# Patient Record
Sex: Female | Born: 1975 | Race: White | Hispanic: No | Smoking: Former smoker
Health system: Southern US, Community
[De-identification: ages and names within clinical notes are randomized; demographics above are authoritative.]

## PROBLEM LIST (undated history)

## (undated) ENCOUNTER — Inpatient Hospital Stay (HOSPITAL_COMMUNITY): Payer: Self-pay

## (undated) DIAGNOSIS — G5600 Carpal tunnel syndrome, unspecified upper limb: Secondary | ICD-10-CM

## (undated) DIAGNOSIS — Z87442 Personal history of urinary calculi: Secondary | ICD-10-CM

## (undated) DIAGNOSIS — D649 Anemia, unspecified: Secondary | ICD-10-CM

## (undated) DIAGNOSIS — Z9889 Other specified postprocedural states: Secondary | ICD-10-CM

## (undated) DIAGNOSIS — K589 Irritable bowel syndrome without diarrhea: Secondary | ICD-10-CM

## (undated) DIAGNOSIS — R112 Nausea with vomiting, unspecified: Secondary | ICD-10-CM

## (undated) DIAGNOSIS — R933 Abnormal findings on diagnostic imaging of other parts of digestive tract: Secondary | ICD-10-CM

## (undated) DIAGNOSIS — K219 Gastro-esophageal reflux disease without esophagitis: Secondary | ICD-10-CM

## (undated) DIAGNOSIS — L237 Allergic contact dermatitis due to plants, except food: Secondary | ICD-10-CM

## (undated) DIAGNOSIS — O2686 Pruritic urticarial papules and plaques of pregnancy (PUPPP): Secondary | ICD-10-CM

## (undated) DIAGNOSIS — O210 Mild hyperemesis gravidarum: Secondary | ICD-10-CM

## (undated) DIAGNOSIS — N200 Calculus of kidney: Secondary | ICD-10-CM

## (undated) HISTORY — PX: ROOT CANAL: SHX2363

## (undated) HISTORY — DX: Anemia, unspecified: D64.9

## (undated) HISTORY — DX: Irritable bowel syndrome, unspecified: K58.9

## (undated) HISTORY — PX: WISDOM TOOTH EXTRACTION: SHX21

## (undated) HISTORY — DX: Mild hyperemesis gravidarum: O21.0

## (undated) HISTORY — DX: Allergic contact dermatitis due to plants, except food: L23.7

## (undated) HISTORY — PX: COLONOSCOPY: SHX174

## (undated) HISTORY — PX: ABDOMINOPLASTY: SUR9

---

## 2006-02-26 ENCOUNTER — Ambulatory Visit: Payer: Self-pay | Admitting: Gastroenterology

## 2006-03-08 ENCOUNTER — Ambulatory Visit: Payer: Self-pay | Admitting: Gastroenterology

## 2006-03-23 ENCOUNTER — Ambulatory Visit: Payer: Self-pay | Admitting: Gastroenterology

## 2006-03-30 ENCOUNTER — Ambulatory Visit: Payer: Self-pay | Admitting: Gastroenterology

## 2008-04-02 ENCOUNTER — Other Ambulatory Visit: Admission: RE | Admit: 2008-04-02 | Discharge: 2008-04-02 | Payer: Self-pay | Admitting: Obstetrics and Gynecology

## 2010-05-23 ENCOUNTER — Ambulatory Visit (HOSPITAL_COMMUNITY)
Admission: RE | Admit: 2010-05-23 | Discharge: 2010-05-23 | Payer: Self-pay | Source: Home / Self Care | Attending: Obstetrics and Gynecology | Admitting: Obstetrics and Gynecology

## 2010-07-03 ENCOUNTER — Emergency Department (HOSPITAL_COMMUNITY): Payer: BC Managed Care – PPO

## 2010-07-03 ENCOUNTER — Emergency Department (HOSPITAL_COMMUNITY)
Admission: EM | Admit: 2010-07-03 | Discharge: 2010-07-03 | Disposition: A | Discharge status: Home / Self Care | Payer: BC Managed Care – PPO | Attending: Emergency Medicine | Admitting: Emergency Medicine

## 2010-07-03 DIAGNOSIS — R112 Nausea with vomiting, unspecified: Secondary | ICD-10-CM | POA: Insufficient documentation

## 2010-07-03 DIAGNOSIS — N201 Calculus of ureter: Secondary | ICD-10-CM | POA: Insufficient documentation

## 2010-07-03 DIAGNOSIS — R109 Unspecified abdominal pain: Secondary | ICD-10-CM | POA: Insufficient documentation

## 2010-07-03 LAB — URINALYSIS, ROUTINE W REFLEX MICROSCOPIC
Bilirubin Urine: NEGATIVE
Ketones, ur: NEGATIVE mg/dL
Leukocytes, UA: NEGATIVE
Nitrite: NEGATIVE
Protein, ur: NEGATIVE mg/dL
Specific Gravity, Urine: 1.024 (ref 1.005–1.030)
Urine Glucose, Fasting: NEGATIVE mg/dL
Urobilinogen, UA: 0.2 mg/dL (ref 0.0–1.0)
pH: 6.5 (ref 5.0–8.0)

## 2010-07-03 LAB — POCT I-STAT, CHEM 8
BUN: 15 mg/dL (ref 6–23)
HCT: 41 % (ref 36.0–46.0)
Sodium: 140 mEq/L (ref 135–145)

## 2010-07-03 LAB — URINE MICROSCOPIC-ADD ON

## 2010-07-03 LAB — PREGNANCY, URINE: Preg Test, Ur: NEGATIVE

## 2010-09-19 NOTE — Assessment & Plan Note (Signed)
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  She underwent  screening colonoscopy on March 08, 2006, as a direct referral because of a  history of colon polyps removed in [place] 2 years ago.  She has a  grandmother who had colon cancer in her 53's.  Her colonoscopy in November,  2005, was entirely normal.   [name] Huang comes in today for new patient evaluation.  She has had bowel problems  going back to childhood, with periodic attacks of generalized abdominal  spasm with occasional nausea and vomiting if the episodes are severe.  There  are no real precipitating events to her pain.  They will usually last 1/2  hour to 6 hours in duration and will even occur maybe 1-2 times a week or up  to a couple of times a month, occasionally related to her menstrual cycles.  She apparently has had both upper GI and small bowel series and endoscopy in  the past which is unremarkable, although we do not have these reports.  She  has tried dietary journals and has only been able to come up with worsening  of her symptoms by eating apples and bananas.  She is on a high fiber diet  and has excessive gas associated with such.  She apparently has been  diagnosed as having irritable bowel syndrome, underlying irritable bowel  syndrome by a physician in [place].  She denies any hepatobiliary  complaints, significant acid reflux, dysphagia, anorexia, weight loss, or  change in her bowel habits.  With her high fiber diet, she goes to the  bathroom daily.  She has had no fever, chills, skin rashes, joint pains, or  oral stomatitis.  She apparently has seen an allergist and had a  negative  food allergy workup.   Her family of origin is Film/video editor.  She has apparently not been tested,  to her knowledge, for celiac disease.  At the time of her colonoscopy at age  7 she apparently had one adenoma and one hyperplastic polyp.   PAST FAMILY HISTORY:  Patient does have ALLERGIC ASTHMA AND TAKES P.R.N.  ALBUTEROL INHALERS.  She denies any previous abdominal or pelvic surgery.   FAMILY HISTORY:  There are multiple family members with a variety of cancers  including breast, lung, and skin cancer but no known gynecologic cancer or  colon cancer other than her grandmother.  Her father does have diabetes.   SOCIAL HISTORY:  The patient is single and lives with a roommate.  She has a  Master's degree.  She quit smoking a year ago and uses ethanol socially.  She denies problems with ethanol abuse.   REVIEW OF SYSTEMS:  Entirely noncontributory.  Last menstrual period was  February 25, 2006.   MEDICATIONS:  None.  ALLERGIES:  NONE.   PHYSICAL EXAMINATION:  She is an attractive, healthy appearing white female  appearing her stated age.  She is 5 feet 2 inches tall and weighs 158  pounds.  Blood pressure is 100/60 and pulse was 60 and regular.  I could not  appreciate stigmata of chronic liver disease, thyromegaly or  lymphadenopathy.  CHEST:  Entirely clear to percussion and auscultation.  There were no  murmurs, gallops or rubs noted.  She was in a regular rhythm.  ABDOMEN:  There was no hepatosplenomegaly, abdominal masses, tenderness,  bowel sounds were normal.  There was no succession splash noted in the  epigastric area.  _Peripheral extremeties are normal.  MENTAL STATUS:  Clear.  RECTAL:  Deferred.   ASSESSMENT:  It certainly sounds like [name] Huang has irritable bowel syndrome of  an atypical nature.  Her abdominal attacks seem to be precipitated by  menstrual periods and stress.  She is having no current bowel irregularity  because of her high fiber diet which  has subsequently caused a lot of  abdominal gas and bloating.  The patient says she does not have lactose  intolerance.   RECOMMENDATIONS:  1. Check upper abdominal ultrasound exam to exclude cholelithiasis.  2. Screening laboratory parameters include a CBC, metabolic profile,      amylase, lipase, sedimentation rate, CRP, thyroid function test, serum      carotene, and sprue panel along with B12 and folate levels.  3. Sublingual Levsin use p.r.n. as tolerated.  4. Low gas diet with avoidance of non-digestible carbohydrates.  5. Try to obtain again records from [place] for review.  6. Trial of Probiotic therapy in the form of Align one tablet a day.  7. GI follow up in 2-3 weeks' time.     Vania Rea. Jarold Motto, MD, Caleen Essex, FAGA  Electronically Signed    DRP/MedQ  DD: 03/23/2006  DT: 03/23/2006  Job #: [id number]

## 2011-02-13 LAB — OB RESULTS CONSOLE ABO/RH

## 2011-02-13 LAB — OB RESULTS CONSOLE ANTIBODY SCREEN: Antibody Screen: NEGATIVE

## 2011-02-13 LAB — OB RESULTS CONSOLE RUBELLA ANTIBODY, IGM: Rubella: IMMUNE

## 2011-06-20 ENCOUNTER — Encounter (HOSPITAL_COMMUNITY): Payer: Self-pay | Admitting: *Deleted

## 2011-06-20 ENCOUNTER — Inpatient Hospital Stay (HOSPITAL_COMMUNITY)
Admission: AD | Admit: 2011-06-20 | Discharge: 2011-06-20 | Disposition: A | Discharge status: Home / Self Care | Payer: BC Managed Care – PPO | Source: Ambulatory Visit | Attending: Obstetrics and Gynecology | Admitting: Obstetrics and Gynecology

## 2011-06-20 DIAGNOSIS — O99891 Other specified diseases and conditions complicating pregnancy: Secondary | ICD-10-CM | POA: Insufficient documentation

## 2011-06-20 DIAGNOSIS — R059 Cough, unspecified: Secondary | ICD-10-CM | POA: Insufficient documentation

## 2011-06-20 DIAGNOSIS — R05 Cough: Secondary | ICD-10-CM | POA: Insufficient documentation

## 2011-06-20 DIAGNOSIS — J069 Acute upper respiratory infection, unspecified: Secondary | ICD-10-CM | POA: Insufficient documentation

## 2011-06-20 HISTORY — DX: Calculus of kidney: N20.0

## 2011-06-20 HISTORY — DX: Abnormal findings on diagnostic imaging of other parts of digestive tract: R93.3

## 2011-06-20 MED ORDER — GUAIFENESIN-CODEINE 100-10 MG/5ML PO SYRP: 5.0000 mL | ORAL_SOLUTION | Freq: Four times a day (QID) | ORAL | Status: AC | PRN

## 2011-06-20 NOTE — ED Notes (Signed)
Daniella CNM called unit and aware of pt's arrival. Will be in to see pt. Pt aware

## 2011-06-20 NOTE — ED Notes (Signed)
Daniella CNM in to see pt. EFM reviewed.

## 2011-06-20 NOTE — Progress Notes (Signed)
Written and verbal d/c instructions given and understanding voiced. 

## 2011-06-20 NOTE — Progress Notes (Signed)
Pt states, " I've had a sinus infection this week, and been on a Z-pack. It is now draining to my lungs and I'm short of breath today with some mild wheezing. The on call nurse told me to come in."

## 2011-06-20 NOTE — ED Provider Notes (Signed)
History   [name] Huang is a 36 y.o. G3P0020 at [redacted]w[redacted]d gestation. C/O upper respiratory infection for past few days with non-productive cough, rhinorrhea, low grade temp for which she takes Tylenol PRN. Hx of asthma, last attack > 5 yrs ago; feels like chest is "tight" and has a hard time catching her breath after coughing spells. Now feels well, cough usually worse when lying down. Has rescue Ventolin MDI, used x 5 today, feels it's not effective any more. On Z-Pak prophylactic. Used Advair in the past for seasonal triggers.  Normal pregnancy course to date.  Chief Complaint  Patient presents with  . Asthma     OB History    Grav Para Term Preterm Abortions TAB SAB Ect Mult Living   3 0 0  2 0 2 0 0 0      Past Medical History  Diagnosis Date  . Asthma   . Abnormal colonoscopy     precancerous cells removed  . Kidney stone     History reviewed. No pertinent past surgical history.  Family History  Problem Relation Age of Onset  . Anesthesia problems Neg Hx   . Hypotension Neg Hx   . Malignant hyperthermia Neg Hx   . Pseudochol deficiency Neg Hx     History  Substance Use Topics  . Smoking status: Never Smoker   . Smokeless tobacco: Not on file  . Alcohol Use: No    Allergies:  Allergies  Allergen Reactions  . Amoxicillin Hives    Prescriptions prior to admission  Medication Sig Dispense Refill  . acetaminophen (TYLENOL) 500 MG tablet Take 500 mg by mouth every 6 (six) hours as needed. For pain      . albuterol (PROVENTIL HFA;VENTOLIN HFA) 108 (90 BASE) MCG/ACT inhaler Inhale 2 puffs into the lungs every 6 (six) hours as needed.      Marland Kitchen azithromycin (ZITHROMAX) 250 MG tablet Take 500 mg by mouth once.      Marland Kitchen azithromycin (ZITHROMAX) 250 MG tablet Take 250 mg by mouth daily.      . diphenhydrAMINE (BENADRYL) 25 mg capsule Take 25 mg by mouth every 6 (six) hours as needed.      . Prenatal Vit-Fe Fumarate-FA (PRENATAL MULTIVITAMIN) TABS Take 1 tablet by mouth daily.       Marland Kitchen DISCONTD: guaiFENesin (ROBITUSSIN) 100 MG/5ML SOLN Take 5 mLs by mouth every 4 (four) hours as needed.        ROS as noted Physical Exam   Blood pressure 118/72, pulse 87, temperature 97.2 F (36.2 C), temperature source Oral, resp. rate 20, height 5' 2.5" (1.588 m), weight 183 lb 4 oz (83.122 kg), SpO2 100.00%.  Gen: AAO x 3, NAD CV: RRR Pulm: clear bilat, to bases, no wheezes or rhonchi, no stridor, O2 sat 100% at room air Abd: gravid, NT Pelvic: deferred Ext: no edema  FHT's 135, moderate variability, (+) accel's, no decel's Toco: no ctx  ED Course  IUP at [redacted]w[redacted]d with URI and non-productive cough with ? asthma exacerbation / stable Reassuring fetal status  Codeine cough suppressant solution Advised use MDI Ventolin as needed Humidifier at [facility] [place], elevate head of bed for sleep Neti pot rinses BID to reduce rhinorrhea and nasal congestion F/U w/ PCP on Monday / evaluate asthma, need for steroid inhalant  Camden Mazzaferro  06/20/2011 @ 2345

## 2011-06-20 NOTE — Progress Notes (Signed)
Daniella CNM in to see pt. Discussing d/c plan with pt. EFM strip reviewedl

## 2011-07-02 ENCOUNTER — Encounter (HOSPITAL_COMMUNITY): Payer: Self-pay | Admitting: *Deleted

## 2011-07-02 ENCOUNTER — Inpatient Hospital Stay (HOSPITAL_COMMUNITY)
Admission: AD | Admit: 2011-07-02 | Discharge: 2011-07-02 | Disposition: A | Discharge status: Home / Self Care | Payer: BC Managed Care – PPO | Source: Ambulatory Visit | Attending: Obstetrics and Gynecology | Admitting: Obstetrics and Gynecology

## 2011-07-02 DIAGNOSIS — O26899 Other specified pregnancy related conditions, unspecified trimester: Secondary | ICD-10-CM

## 2011-07-02 DIAGNOSIS — R109 Unspecified abdominal pain: Secondary | ICD-10-CM | POA: Insufficient documentation

## 2011-07-02 DIAGNOSIS — O4700 False labor before 37 completed weeks of gestation, unspecified trimester: Secondary | ICD-10-CM | POA: Insufficient documentation

## 2011-07-02 NOTE — Progress Notes (Signed)
Pt states cramping started this morning  About 10am . Pt called about 2000.

## 2011-07-02 NOTE — ED Notes (Signed)
History   36 yo G1P0 MWF presents @ 28 4/[redacted] wk gestation for evaluation of all day cramping since 10 am despite tyenolol and sleeping.  Chief Complaint  Patient presents with  . Abdominal Pain     OB History    Grav Para Term Preterm Abortions TAB SAB Ect Mult Living   3 0 0 0 2 0 2 0 0 0       Past Medical History  Diagnosis Date  . Asthma   . Abnormal colonoscopy     precancerous cells removed  . Kidney stone     History reviewed. No pertinent past surgical history.  Family History  Problem Relation Age of Onset  . Anesthesia problems Neg Hx   . Hypotension Neg Hx   . Malignant hyperthermia Neg Hx   . Pseudochol deficiency Neg Hx   . Hypertension Mother   . Diabetes Mother   . Hypertension Father   . Diabetes Father     History  Substance Use Topics  . Smoking status: Never Smoker   . Smokeless tobacco: Not on file  . Alcohol Use: No    Allergies:  Allergies  Allergen Reactions  . Amoxicillin Hives    Prescriptions prior to admission  Medication Sig Dispense Refill  . acetaminophen (TYLENOL) 500 MG tablet Take 500 mg by mouth every 6 (six) hours as needed. For pain      . albuterol (PROVENTIL HFA;VENTOLIN HFA) 108 (90 BASE) MCG/ACT inhaler Inhale 2 puffs into the lungs every 6 (six) hours as needed.      Marland Kitchen azithromycin (ZITHROMAX) 250 MG tablet Take 500 mg by mouth once.      . diphenhydrAMINE (BENADRYL) 25 mg capsule Take 25 mg by mouth every 6 (six) hours as needed.      . Prenatal Vit-Fe Fumarate-FA (PRENATAL MULTIVITAMIN) TABS Take 1 tablet by mouth daily.      . pseudoephedrine (SUDAFED) 30 MG tablet Take 30 mg by mouth every 4 (four) hours as needed. Sinus pressure         Physical Exam   Blood pressure 126/71, pulse 114, temperature 98 F (36.7 C), temperature source Oral, resp. rate 18.  General appearance: alert, cooperative and no distress Lungs: clear to auscultation bilaterally Heart: regular rate and rhythm, S1, S2 normal, no murmur,  click, rub or gallop Abdomen: gravid soft nontender Pelvic: long closed midposition med ED Course  Abdomiinal cramping IUP @ 28 4/7 wk P) PTL prec. Increase po fluid intake. Keep sched OB appt MDM  Makaleigh Reinard A, MD 10:30 PM 07/02/2011

## 2011-07-02 NOTE — Discharge Instructions (Signed)

## 2011-07-10 ENCOUNTER — Emergency Department (HOSPITAL_COMMUNITY)
Admission: EM | Admit: 2011-07-10 | Discharge: 2011-07-10 | Discharge status: Against Medical Advice | Payer: BC Managed Care – PPO | Attending: Emergency Medicine | Admitting: Emergency Medicine

## 2011-07-10 DIAGNOSIS — X58XXXA Exposure to other specified factors, initial encounter: Secondary | ICD-10-CM | POA: Insufficient documentation

## 2011-07-10 DIAGNOSIS — S99929A Unspecified injury of unspecified foot, initial encounter: Secondary | ICD-10-CM | POA: Insufficient documentation

## 2011-07-10 DIAGNOSIS — S8990XA Unspecified injury of unspecified lower leg, initial encounter: Secondary | ICD-10-CM | POA: Insufficient documentation

## 2011-09-17 ENCOUNTER — Telehealth (HOSPITAL_COMMUNITY): Payer: Self-pay | Admitting: *Deleted

## 2011-09-17 ENCOUNTER — Other Ambulatory Visit: Payer: Self-pay | Admitting: Obstetrics and Gynecology

## 2011-09-17 ENCOUNTER — Encounter (HOSPITAL_COMMUNITY): Payer: Self-pay | Admitting: *Deleted

## 2011-09-17 NOTE — Telephone Encounter (Signed)
Preadmission screen  

## 2011-09-21 ENCOUNTER — Inpatient Hospital Stay (HOSPITAL_COMMUNITY)
Admission: RE | Admit: 2011-09-21 | Discharge: 2011-09-25 | DRG: 371 | Disposition: A | Discharge status: Home / Self Care | Payer: BC Managed Care – PPO | Source: Ambulatory Visit | Attending: Obstetrics and Gynecology | Admitting: Obstetrics and Gynecology

## 2011-09-21 ENCOUNTER — Encounter (HOSPITAL_COMMUNITY): Payer: Self-pay

## 2011-09-21 DIAGNOSIS — O480 Post-term pregnancy: Principal | ICD-10-CM | POA: Diagnosis present

## 2011-09-21 DIAGNOSIS — O3660X0 Maternal care for excessive fetal growth, unspecified trimester, not applicable or unspecified: Secondary | ICD-10-CM | POA: Diagnosis present

## 2011-09-21 DIAGNOSIS — O409XX0 Polyhydramnios, unspecified trimester, not applicable or unspecified: Secondary | ICD-10-CM | POA: Diagnosis present

## 2011-09-21 HISTORY — DX: Carpal tunnel syndrome, unspecified upper limb: G56.00

## 2011-09-21 HISTORY — DX: Pruritic urticarial papules and plaques of pregnancy (puppp): O26.86

## 2011-09-21 LAB — CBC
HCT: 36.8 % (ref 36.0–46.0)
Hemoglobin: 12.5 g/dL (ref 12.0–15.0)
MCH: 30.8 pg (ref 26.0–34.0)
MCV: 90.6 fL (ref 78.0–100.0)
RBC: 4.06 MIL/uL (ref 3.87–5.11)
WBC: 10.6 10*3/uL — ABNORMAL HIGH (ref 4.0–10.5)

## 2011-09-21 MED ORDER — FLEET ENEMA 7-19 GM/118ML RE ENEM: 1.0000 | ENEMA | RECTAL | Status: DC | PRN

## 2011-09-21 MED ORDER — LACTATED RINGERS IV SOLN
INTRAVENOUS | Status: DC
  Administered 2011-09-22 (×2): via INTRAVENOUS

## 2011-09-21 MED ORDER — ACETAMINOPHEN 325 MG PO TABS: 650.0000 mg | ORAL_TABLET | ORAL | Status: DC | PRN

## 2011-09-21 MED ORDER — DOXYLAMINE SUCCINATE (SLEEP) 25 MG PO TABS
25.0000 mg | ORAL_TABLET | Freq: Every evening | ORAL | Status: DC | PRN
  Administered 2011-09-21: 25 mg via ORAL
  Filled 2011-09-21: qty 1

## 2011-09-21 MED ORDER — OXYTOCIN 20 UNITS IN LACTATED RINGERS INFUSION - SIMPLE
1.0000 m[IU]/min | INTRAVENOUS | Status: DC
  Administered 2011-09-22: 1 m[IU]/min via INTRAVENOUS

## 2011-09-21 MED ORDER — TERBUTALINE SULFATE 1 MG/ML IJ SOLN: 0.2500 mg | Freq: Once | INTRAMUSCULAR | Status: AC | PRN

## 2011-09-21 MED ORDER — ZOLPIDEM TARTRATE 10 MG PO TABS: 10.0000 mg | ORAL_TABLET | Freq: Every evening | ORAL | Status: DC | PRN

## 2011-09-21 MED ORDER — LACTATED RINGERS IV SOLN
500.0000 mL | INTRAVENOUS | Status: DC | PRN
  Administered 2011-09-21: 1000 mL via INTRAVENOUS

## 2011-09-21 MED ORDER — OXYCODONE-ACETAMINOPHEN 5-325 MG PO TABS: 1.0000 | ORAL_TABLET | ORAL | Status: DC | PRN

## 2011-09-21 MED ORDER — OXYTOCIN 20 UNITS IN LACTATED RINGERS INFUSION - SIMPLE: 125.0000 mL/h | Freq: Once | INTRAVENOUS | Status: DC

## 2011-09-21 MED ORDER — OXYTOCIN BOLUS FROM INFUSION
500.0000 mL | Freq: Once | INTRAVENOUS | Status: DC
  Filled 2011-09-21: qty 500
  Filled 2011-09-21: qty 1000

## 2011-09-21 MED ORDER — OXYTOCIN 20 UNITS IN LACTATED RINGERS INFUSION - SIMPLE: 1.0000 m[IU]/min | INTRAVENOUS | Status: DC

## 2011-09-21 MED ORDER — LIDOCAINE HCL (PF) 1 % IJ SOLN: 30.0000 mL | INTRAMUSCULAR | Status: DC | PRN

## 2011-09-21 MED ORDER — ONDANSETRON HCL 4 MG/2ML IJ SOLN
4.0000 mg | Freq: Four times a day (QID) | INTRAMUSCULAR | Status: DC | PRN
  Administered 2011-09-22: 4 mg via INTRAVENOUS
  Filled 2011-09-21: qty 2

## 2011-09-21 MED ORDER — CITRIC ACID-SODIUM CITRATE 334-500 MG/5ML PO SOLN
30.0000 mL | ORAL | Status: DC | PRN
  Administered 2011-09-21 – 2011-09-22 (×4): 30 mL via ORAL
  Filled 2011-09-21 (×4): qty 15

## 2011-09-21 MED ORDER — MISOPROSTOL 25 MCG QUARTER TABLET
25.0000 ug | ORAL_TABLET | ORAL | Status: DC | PRN
  Administered 2011-09-21: 25 ug via VAGINAL
  Filled 2011-09-21: qty 0.25

## 2011-09-21 MED ORDER — IBUPROFEN 600 MG PO TABS: 600.0000 mg | ORAL_TABLET | Freq: Four times a day (QID) | ORAL | Status: DC | PRN

## 2011-09-21 NOTE — H&P (Signed)
[name], Huang NO.:  [id number]  MEDICAL RECORD NO.:  [id number]  LOCATION:  9173                          FACILITY:  [facility]  PHYSICIAN:  Lenoard Aden, M.D.DATE OF BIRTH:  [birth year]  DATE OF ADMISSION:  09/21/2011 DATE OF DISCHARGE:                             HISTORY & PHYSICAL   CHIEF COMPLAINT:  Post dates with LGA, mild polyhydramnios and marginal cord insertion, for induction of 40 weeks.  HISTORY OF PRESENT ILLNESS:  She is a 36 year old white female, G3, P0, at 40 weeks and 1 day, with a history of postterm status marginal cord insertion for presumed LGA, apparently some cervical ripening and induction.  ALLERGIES:  PENICILLIN.  No latex allergies.  SOCIAL HISTORY:  Nonsmoker.  Nondrinker.  Denies physical violence.  PAST MEDICAL HISTORY:  Asthma, colonic polyps, also induced spontaneous abortion in 2004 and 2012.  FAMILY HISTORY:  Breast cancer, hypertension, bipolar disorder, heart disease, diabetes, and unspecified cancer.  Prenatal course was complicated as noted by marginal cord insertion ________.  PHYSICAL EXAMINATION:  GENERAL:  Well-developed, well-nourished white female, in no acute distress. VITAL SIGNS:  Blood pressure 120/86. HEENT:  Normal. NECK:  Supple.  Full range of motion. LUNGS:  Clear. HEART:  Regular rate and rhythm. ABDOMEN:  Soft, gravid, nontender.  Estimated fetal weight 8.5 pounds. Cervix is fingertip, 50%, vertex, soft, anterior -2. EXTREMITIES:  There are no cords. NEUROLOGIC:  Nonfocal. SKIN:  Intact.  IMPRESSION: 1. Forty weeks and one day intrauterine pregnancy. 2. Marginal cord insertion. 3. Large for gestational age with polyhydramnios.  PLAN:  Proceed with induction of Cytotec placement.     Lenoard Aden, M.D.     RJT/MEDQ  D:  09/21/2011  T:  09/21/2011  Job:  [id number]

## 2011-09-21 NOTE — Progress Notes (Signed)
[name] Huang is a 36 y.o. G3P0020 at [redacted]w[redacted]d by LMP admitted for induction of labor due to Post dates. Due date 5/18 and presumed LGA with mild Polyhydramnios.  Subjective: Comfortable.  Objective: BP 129/86  Pulse 101  Temp(Src) 98.2 F (36.8 C) (Oral)  Resp 18  Ht 5\' 2"  (1.575 m)  Wt 94.802 kg (209 lb)  BMI 38.23 kg/m2      FHT:  FHR: 145 bpm, variability: moderate,  accelerations:  Present,  decelerations:  Absent UC:   irregular, every 10 minutes SVE:   FT/50/VTX/soft /anterior/-1 Cytotec placed Labs: Lab Results  Component Value Date   WBC 10.6* 09/21/2011   HGB 12.5 09/21/2011   HCT 36.8 09/21/2011   MCV 90.6 09/21/2011   PLT 233 09/21/2011    Assessment / Plan: Postdates LGA/Polyhydramnios  Labor: Progressing normally on Cytotec Preeclampsia:  na Fetal Wellbeing:  Category I Pain Control:  Labor support without medications I/D:  n/a Anticipated MOD:  Cautious approach to SVD vs Csec. Pt understands inherent risks in induction vs expectant management.  [name] Huang J 09/21/2011, 9:38 PM

## 2011-09-22 ENCOUNTER — Encounter (HOSPITAL_COMMUNITY): Payer: Self-pay | Admitting: Anesthesiology

## 2011-09-22 ENCOUNTER — Encounter (HOSPITAL_COMMUNITY)
Admission: RE | Disposition: A | Discharge status: Home / Self Care | Payer: Self-pay | Source: Ambulatory Visit | Attending: Obstetrics and Gynecology

## 2011-09-22 ENCOUNTER — Inpatient Hospital Stay (HOSPITAL_COMMUNITY): Payer: BC Managed Care – PPO | Admitting: Anesthesiology

## 2011-09-22 ENCOUNTER — Encounter (HOSPITAL_COMMUNITY): Payer: Self-pay

## 2011-09-22 SURGERY — Surgical Case
Anesthesia: Epidural | Site: Abdomen | Wound class: Clean Contaminated

## 2011-09-22 MED ORDER — BUPIVACAINE HCL (PF) 0.25 % IJ SOLN
INTRAMUSCULAR | Status: AC
  Filled 2011-09-22: qty 30

## 2011-09-22 MED ORDER — EPHEDRINE 5 MG/ML INJ: 10.0000 mg | INTRAVENOUS | Status: DC | PRN

## 2011-09-22 MED ORDER — OXYTOCIN 10 UNIT/ML IJ SOLN
INTRAMUSCULAR | Status: DC | PRN
  Administered 2011-09-22 (×2): 20 [IU] via INTRAMUSCULAR

## 2011-09-22 MED ORDER — PHENYLEPHRINE 40 MCG/ML (10ML) SYRINGE FOR IV PUSH (FOR BLOOD PRESSURE SUPPORT)
80.0000 ug | PREFILLED_SYRINGE | INTRAVENOUS | Status: DC | PRN
  Filled 2011-09-22: qty 5

## 2011-09-22 MED ORDER — LIDOCAINE HCL (PF) 1 % IJ SOLN
INTRAMUSCULAR | Status: DC | PRN
  Administered 2011-09-22 (×2): 8 mL

## 2011-09-22 MED ORDER — OXYTOCIN 20 UNITS IN LACTATED RINGERS INFUSION - SIMPLE: 1.0000 m[IU]/min | INTRAVENOUS | Status: DC

## 2011-09-22 MED ORDER — MORPHINE SULFATE 0.5 MG/ML IJ SOLN
INTRAMUSCULAR | Status: AC
  Filled 2011-09-22: qty 10

## 2011-09-22 MED ORDER — SODIUM BICARBONATE 8.4 % IV SOLN
INTRAVENOUS | Status: DC | PRN
  Administered 2011-09-22 (×2): 5 mL via EPIDURAL

## 2011-09-22 MED ORDER — MEPERIDINE HCL 25 MG/ML IJ SOLN
INTRAMUSCULAR | Status: AC
  Filled 2011-09-22: qty 1

## 2011-09-22 MED ORDER — MORPHINE SULFATE (PF) 0.5 MG/ML IJ SOLN
INTRAMUSCULAR | Status: DC | PRN
  Administered 2011-09-22: 4 mg via EPIDURAL

## 2011-09-22 MED ORDER — ONDANSETRON HCL 4 MG/2ML IJ SOLN
INTRAMUSCULAR | Status: AC
  Filled 2011-09-22: qty 2

## 2011-09-22 MED ORDER — FENTANYL 2.5 MCG/ML BUPIVACAINE 1/10 % EPIDURAL INFUSION (WH - ANES)
INTRAMUSCULAR | Status: DC | PRN
  Administered 2011-09-22: 14 mL/h via EPIDURAL

## 2011-09-22 MED ORDER — OXYTOCIN 10 UNIT/ML IJ SOLN
INTRAMUSCULAR | Status: AC
  Filled 2011-09-22: qty 2

## 2011-09-22 MED ORDER — PHENYLEPHRINE 40 MCG/ML (10ML) SYRINGE FOR IV PUSH (FOR BLOOD PRESSURE SUPPORT)
PREFILLED_SYRINGE | INTRAVENOUS | Status: AC
  Filled 2011-09-22: qty 5

## 2011-09-22 MED ORDER — MEPERIDINE HCL 25 MG/ML IJ SOLN
INTRAMUSCULAR | Status: DC | PRN
  Administered 2011-09-22: 12.5 mg via INTRAVENOUS

## 2011-09-22 MED ORDER — DEXTROSE 5 % IV SOLN
2.0000 g | INTRAVENOUS | Status: AC
  Administered 2011-09-22: 2 g via INTRAVENOUS
  Filled 2011-09-22: qty 2

## 2011-09-22 MED ORDER — FENTANYL 2.5 MCG/ML BUPIVACAINE 1/10 % EPIDURAL INFUSION (WH - ANES)
14.0000 mL/h | INTRAMUSCULAR | Status: DC
  Administered 2011-09-22 (×3): 14 mL/h via EPIDURAL
  Filled 2011-09-22 (×4): qty 60

## 2011-09-22 MED ORDER — DIPHENHYDRAMINE HCL 50 MG/ML IJ SOLN: 12.5000 mg | INTRAMUSCULAR | Status: DC | PRN

## 2011-09-22 MED ORDER — EPHEDRINE 5 MG/ML INJ
10.0000 mg | INTRAVENOUS | Status: DC | PRN
  Filled 2011-09-22: qty 4

## 2011-09-22 MED ORDER — LACTATED RINGERS IV SOLN
500.0000 mL | Freq: Once | INTRAVENOUS | Status: AC
  Administered 2011-09-22: 1000 mL via INTRAVENOUS

## 2011-09-22 MED ORDER — LACTATED RINGERS IV SOLN
INTRAVENOUS | Status: DC | PRN
  Administered 2011-09-22 (×3): via INTRAVENOUS

## 2011-09-22 MED ORDER — MORPHINE SULFATE (PF) 0.5 MG/ML IJ SOLN
INTRAMUSCULAR | Status: DC | PRN
  Administered 2011-09-22: 1 mg via INTRAVENOUS

## 2011-09-22 MED ORDER — PHENYLEPHRINE 40 MCG/ML (10ML) SYRINGE FOR IV PUSH (FOR BLOOD PRESSURE SUPPORT): 80.0000 ug | PREFILLED_SYRINGE | INTRAVENOUS | Status: DC | PRN

## 2011-09-22 MED ORDER — BUPIVACAINE HCL (PF) 0.25 % IJ SOLN
INTRAMUSCULAR | Status: DC | PRN
  Administered 2011-09-22: 10 mL

## 2011-09-22 SURGICAL SUPPLY — 30 items
CLOTH BEACON ORANGE TIMEOUT ST (SAFETY) ×2 IMPLANT
CONTAINER PREFILL 10% NBF 15ML (MISCELLANEOUS) IMPLANT
DRESSING TELFA 8X3 (GAUZE/BANDAGES/DRESSINGS) IMPLANT
ELECT REM PT RETURN 9FT ADLT (ELECTROSURGICAL) ×2
ELECTRODE REM PT RTRN 9FT ADLT (ELECTROSURGICAL) ×1 IMPLANT
EXTRACTOR VACUUM M CUP 4 TUBE (SUCTIONS) IMPLANT
GAUZE SPONGE 4X4 12PLY STRL LF (GAUZE/BANDAGES/DRESSINGS) ×2 IMPLANT
GLOVE BIO SURGEON STRL SZ7.5 (GLOVE) ×4 IMPLANT
GOWN PREVENTION PLUS LG XLONG (DISPOSABLE) ×4 IMPLANT
GOWN PREVENTION PLUS XLARGE (GOWN DISPOSABLE) ×2 IMPLANT
KIT ABG SYR 3ML LUER SLIP (SYRINGE) IMPLANT
NDL HYPO 25X1 1.5 SAFETY (NEEDLE) ×1 IMPLANT
NDL HYPO 25X5/8 SAFETYGLIDE (NEEDLE) IMPLANT
NEEDLE HYPO 25X1 1.5 SAFETY (NEEDLE) ×2 IMPLANT
NEEDLE HYPO 25X5/8 SAFETYGLIDE (NEEDLE) IMPLANT
NS IRRIG 1000ML POUR BTL (IV SOLUTION) ×2 IMPLANT
PACK C SECTION WH (CUSTOM PROCEDURE TRAY) ×2 IMPLANT
PAD ABD 7.5X8 STRL (GAUZE/BANDAGES/DRESSINGS) IMPLANT
SLEEVE SCD COMPRESS KNEE MED (MISCELLANEOUS) IMPLANT
STAPLER VISISTAT 35W (STAPLE) ×2 IMPLANT
SUT MNCRL 0 VIOLET CTX 36 (SUTURE) ×2 IMPLANT
SUT MON AB 2-0 CT1 27 (SUTURE) ×2 IMPLANT
SUT MON AB-0 CT1 36 (SUTURE) ×4 IMPLANT
SUT MONOCRYL 0 CTX 36 (SUTURE) ×3
SUT PLAIN 0 NONE (SUTURE) IMPLANT
SUT PLAIN 2 0 XLH (SUTURE) ×1 IMPLANT
SYR CONTROL 10ML LL (SYRINGE) ×1 IMPLANT
TOWEL OR 17X24 6PK STRL BLUE (TOWEL DISPOSABLE) ×4 IMPLANT
TRAY FOLEY CATH 14FR (SET/KITS/TRAYS/PACK) ×1 IMPLANT
WATER STERILE IRR 1000ML POUR (IV SOLUTION) ×1 IMPLANT

## 2011-09-22 NOTE — Progress Notes (Signed)
Still c/o of pain at site. No inflitration noted.

## 2011-09-22 NOTE — Progress Notes (Signed)
[name] Huang is a 36 y.o. G3P0020 at [redacted]w[redacted]d by LMP admitted for induction of labor due to Post dates. Due date 5/18  and Marginal CI with borderline oligo.  Subjective: Comfortable.  Objective: BP 146/86  Pulse 83  Temp(Src) 98 F (36.7 C) (Oral)  Resp 18  Ht 5\' 2"  (1.575 m)  Wt 94.802 kg (209 lb)  BMI 38.23 kg/m2  SpO2 100%   Total I/O In: -  Out: 150 [Urine:150]  FHT:  FHR: 155 bpm, variability: moderate,  accelerations:  Present,  decelerations:  Absent UC:   regular, every 3 minutes(MVUs ~200) SVE:   6/80/0  Labs: Lab Results  Component Value Date   WBC 10.6* 09/21/2011   HGB 12.5 09/21/2011   HCT 36.8 09/21/2011   MCV 90.6 09/21/2011   PLT 233 09/21/2011    Assessment / Plan: Induction of labor due to Marginal CI and Postterm,  progressing well on pitocin  Labor: Progressing on Pitocin and adequate by IUPC Preeclampsia:  na Fetal Wellbeing:  Category I Pain Control:  Epidural I/D:  n/a Anticipated MOD:  SVD vs Csection  [name] 09/22/2011, 5:21 PM

## 2011-09-22 NOTE — Progress Notes (Signed)
MD wants pitocin to be started if unable to place another cytotec

## 2011-09-22 NOTE — Progress Notes (Signed)
[name] Huang is a 36 y.o. G3P0020 at [redacted]w[redacted]d by LMP admitted for induction of labor due to Post dates. Due date 5/18 and Marginal CI.  Subjective: Resting well with epidural  Objective: BP 155/100  Pulse 92  Temp(Src) 98.6 F (37 C) (Oral)  Resp 20  Ht 5\' 2"  (1.575 m)  Wt 94.802 kg (209 lb)  BMI 38.23 kg/m2  SpO2 100% I/O last 3 completed shifts: In: -  Out: 150 [Urine:150] Total I/O In: 209.6 [I.V.:209.6] Out: -   FHT:  FHR: 155 bpm, variability: moderate,  accelerations:  Present,  decelerations:  Absent UC:   regular, every 2 minutes MVUs 200 SVE:   8/OT/0  Labs: Lab Results  Component Value Date   WBC 10.6* 09/21/2011   HGB 12.5 09/21/2011   HCT 36.8 09/21/2011   MCV 90.6 09/21/2011   PLT 233 09/21/2011    Assessment / Plan: Induction of labor due to postterm and marginal CI,  progressing well on pitocin  Labor: Progressing normally Preeclampsia:  na Fetal Wellbeing:  Category I Pain Control:  Epidural I/D:  n/a Anticipated MOD:  SVD vs Csection.  [name] Huang J 09/22/2011, 8:28 PM

## 2011-09-22 NOTE — Op Note (Signed)
Cesarean Section Procedure Note  Indications: failure to progress: arrest of dilation  Pre-operative Diagnosis: 40 week 2 day pregnancy.  Post-operative Diagnosis: same  Surgeon: Lenoard Aden   Assistants: mody  Anesthesia: Epidural anesthesia and Local anesthesia 0.25.% bupivacaine  ASA Class: 2  Procedure Details  The patient was seen in the Holding Room. The risks, benefits, complications, treatment options, and expected outcomes were discussed with the patient.  The patient concurred with the proposed plan, giving informed consent. The risks of anesthesia, infection, bleeding and possible injury to other organs discussed. Injury to bowel, bladder, or ureter with possible need for repair discussed. Possible need for transfusion with secondary risks of hepatitis or HIV acquisition discussed. Post operative complications to include but not limited to DVT, PE and Pneumonia noted. The site of surgery properly noted/marked. The patient was taken to Operating Room # 1, identified as Tomasa Dobransky and the procedure verified as C-Section Delivery. A Time Out was held and the above information confirmed.  After induction of anesthesia, the patient was draped and prepped in the usual sterile manner. A Pfannenstiel incision was made and carried down through the subcutaneous tissue to the fascia. Fascial incision was made and extended transversely using Mayo scissors. The fascia was separated from the underlying rectus tissue superiorly and inferiorly. The peritoneum was identified and entered. Peritoneal incision was extended longitudinally. The utero-vesical peritoneal reflection was incised transversely and the bladder flap was bluntly freed from the lower uterine segment. A low transverse uterine incision(Kerr hysterotomy) was made. Delivered from OP presentation was a  female with Apgar scores of 8 at one minute and 9 at five minutes. Bulb suctioning gently performed. Neonatal team in  attendance.After the umbilical cord was clamped and cut cord blood was obtained for evaluation. The placenta was removed intact and appeared normal. The uterus was curetted with a dry lap pack. Good hemostasis was noted.The uterine outline, tubes and ovaries appeared normal. The uterine incision was closed with running locked sutures of 0 Monocryl x 2 layers. Hemostasis was observed. Lavage was carried out until clear.The parietal peritoneum was closed with a running 2-0 Monocryl suture. The fascia was then reapproximated with running sutures of 0 Monocryl. [place] tissue closed with 2-0 plain suture.The skin was reapproximated with staples.  Instrument, sponge, and needle counts were correct prior the abdominal closure and at the conclusion of the case.   Findings: above  Estimated Blood Loss:  500         Drains: foley                 Specimens: placenta to path                 Complications:  None; patient tolerated the procedure well.         Disposition: PACU - hemodynamically stable.         Condition: stable  Attending Attestation: I performed the procedure.

## 2011-09-22 NOTE — Anesthesia Procedure Notes (Signed)
Epidural Patient location during procedure: OB Start time: 09/22/2011 9:37 AM End time: 09/22/2011 9:42 AM Reason for block: procedure for pain  Preanesthetic Checklist Completed: patient identified, site marked, surgical consent, pre-op evaluation, timeout performed, IV checked, risks and benefits discussed and monitors and equipment checked  Epidural Patient position: sitting Prep: site prepped and draped and DuraPrep Patient monitoring: continuous pulse ox and blood pressure Approach: midline Injection technique: LOR air  Needle:  Needle type: Tuohy  Needle gauge: 17 G Needle length: 9 cm Needle insertion depth: 5 cm cm Catheter type: closed end flexible Catheter size: 19 Gauge Catheter at skin depth: 10 cm Test dose: negative and Other  Assessment Sensory level: T10 Events: blood not aspirated, injection not painful, no injection resistance, negative IV test and no paresthesia

## 2011-09-22 NOTE — Anesthesia Preprocedure Evaluation (Addendum)
Anesthesia Evaluation  Patient identified by MRN, date of birth, ID band Patient awake    Reviewed: Allergy & Precautions, H&P , NPO status , Patient's Chart, lab work & pertinent test results  Airway Mallampati: II TM Distance: >3 FB Neck ROM: full    Dental No notable dental hx.    Pulmonary    Pulmonary exam normal       Cardiovascular negative cardio ROS      Neuro/Psych negative psych ROS   GI/Hepatic negative GI ROS, Neg liver ROS,   Endo/Other  Morbid obesity  Renal/GU negative Renal ROS  negative genitourinary   Musculoskeletal negative musculoskeletal ROS (+)   Abdominal (+) + obese,   Peds negative pediatric ROS (+)  Hematology negative hematology ROS (+)   Anesthesia Other Findings   Reproductive/Obstetrics (+) Pregnancy                           Anesthesia Physical Anesthesia Plan  ASA: III  Anesthesia Plan: Epidural   Post-op Pain Management:    Induction:   Airway Management Planned:   Additional Equipment:   Intra-op Plan:   Post-operative Plan:   Informed Consent: I have reviewed the patients History and Physical, chart, labs and discussed the procedure including the risks, benefits and alternatives for the proposed anesthesia with the patient or authorized representative who has indicated his/her understanding and acceptance.     Plan Discussed with:   Anesthesia Plan Comments:         Anesthesia Quick Evaluation  

## 2011-09-22 NOTE — Progress Notes (Signed)
C/o of pain at site. No inflitration noted. Will keep assessing

## 2011-09-22 NOTE — Progress Notes (Signed)
[name] Huang is a 36 y.o. G3P0020 at [redacted]w[redacted]d by LMP admitted for induction of labor due to Post dates. Due date 5/18 and Marginal CI.  Subjective: Comfortable  Objective: BP 127/92  Pulse 74  Temp(Src) 97.9 F (36.6 C) (Oral)  Resp 20  Ht 5\' 2"  (1.575 m)  Wt 94.802 kg (209 lb)  BMI 38.23 kg/m2      FHT:  FHR: 145 bpm, variability: moderate,  accelerations:  Present,  decelerations:  Absent UC:   regular, every 2-3 minutes SVE:   Dilation: 2 Effacement (%): 60 Station: -2 Exam by:: Dr. Billy Coast AROM- clear  Labs: Lab Results  Component Value Date   WBC 10.6* 09/21/2011   HGB 12.5 09/21/2011   HCT 36.8 09/21/2011   MCV 90.6 09/21/2011   PLT 233 09/21/2011    Assessment / Plan: Induction of labor due to postterm and Marginal CI with borderline oligo,  progressing well on pitocin  Labor: Progressing on Pitocin Preeclampsia:  na Fetal Wellbeing:  Category I Pain Control:  Epidural I/D:  n/a Anticipated MOD:  Cautious approach to SVD vs Csection  [name] Huang J 09/22/2011, 8:37 AM

## 2011-09-22 NOTE — Progress Notes (Signed)
Dr. Billy Coast phoned to check patient status.  Physician heading in to perform vaginal exam.

## 2011-09-22 NOTE — Progress Notes (Signed)
[name] Huang is a 36 y.o. G3P0020 at [redacted]w[redacted]d by LMP admitted for induction of labor due to Post dates. Due date 5/18 and Marginal CI.  Subjective: Comfortable  Objective: BP 142/93  Pulse 82  Temp(Src) 97.9 F (36.6 C) (Oral)  Resp 20  Ht 5\' 2"  (1.575 m)  Wt 94.802 kg (209 lb)  BMI 38.23 kg/m2  SpO2 100%      FHT:  FHR: 155 bpm, variability: moderate,  accelerations:  Present,  decelerations:  Absent UC:   regular, every 3 minutes SVE:   Dilation: 4 Effacement (%): 80 Station: -1 Exam by:: [name] Hageman, RN  IUPC placed without difficulty  Labs: Lab Results  Component Value Date   WBC 10.6* 09/21/2011   HGB 12.5 09/21/2011   HCT 36.8 09/21/2011   MCV 90.6 09/21/2011   PLT 233 09/21/2011    Assessment / Plan: Induction of labor due to postterm and Marginal CI,  progressing well on pitocin  Labor: Progressing normally Preeclampsia:  na Fetal Wellbeing:  Category I Pain Control:  Epidural I/D:  n/a Anticipated MOD:  NSVD vs Csection  [name] Huang J 09/22/2011, 1:47 PM

## 2011-09-22 NOTE — Progress Notes (Signed)
Makaylyn Sinyard is a 36 y.o. G3P0020 at [redacted]w[redacted]d by LMP admitted for induction of labor due to Post dates. Due date 5/18, Low amniotic fluid. and Marginal Cord insertion.  Subjective: Comfortable  Objective: BP 137/86  Pulse 85  Temp(Src) 98.2 F (36.8 C) (Oral)  Resp 20  Ht 5\' 2"  (1.575 m)  Wt 94.802 kg (209 lb)  BMI 38.23 kg/m2      FHT:  FHR: 155 bpm, variability: moderate,  accelerations:  Present,  decelerations:  Absent UC:   regular, every 3-5 minutes SVE:   1/60/soft /anterior /-1  Labs: Lab Results  Component Value Date   WBC 10.6* 09/21/2011   HGB 12.5 09/21/2011   HCT 36.8 09/21/2011   MCV 90.6 09/21/2011   PLT 233 09/21/2011    Assessment / Plan: Induction of labor due to postterm and Marginal Cord Insertion,  progressing well on pitocin  Labor: Progressing on Pitocin, will continue to increase then AROM Preeclampsia:  na Fetal Wellbeing:  Category I Pain Control:  Epidural I/D:  n/a Anticipated MOD:  Csection vs SVD  Skyllar Notarianni J 09/22/2011, 7:21 AM

## 2011-09-22 NOTE — Consult Note (Signed)
Neonatology Note:  Attendance at C-section:  I was asked to attend this primary C/S at term due to failure of descent. The mother is a G3P0A2 A pos, GBS neg with an uncomplicated pregnancy. ROM 15 hours prior to delivery, fluid clear. Infant vigorous with good spontaneous cry and tone. Needed only minimal bulb suctioning. Ap 9/9. Lungs clear to ausc in DR. To CN to care of Pediatrician.  Praneeth Bussey, MD  

## 2011-09-22 NOTE — Progress Notes (Signed)
[name] Huang is a 36 y.o. G3P0020 at [redacted]w[redacted]d by LMP admitted for induction of labor due to Post dates. Due date 5/18 and Marginal CI.  Subjective: Feels occ pressure  Objective: BP 121/86  Pulse 103  Temp(Src) 98.6 F (37 C) (Oral)  Resp 18  Ht 5\' 2"  (1.575 m)  Wt 94.802 kg (209 lb)  BMI 38.23 kg/m2  SpO2 100% I/O last 3 completed shifts: In: -  Out: 150 [Urine:150] Total I/O In: 260.6 [I.V.:260.6] Out: 1000 [Urine:1000]  FHT:  FHR: 155 bpm, variability: moderate,  accelerations:  Present,  decelerations:  Absent UC:   regular, every 2 minutes( MVUs 250) SVE:   Dilation: 8 Effacement (%): 80 (right side edematous) Station: 0 Exam by:: Bennye Alm, RN  Labs: Lab Results  Component Value Date   WBC 10.6* 09/21/2011   HGB 12.5 09/21/2011   HCT 36.8 09/21/2011   MCV 90.6 09/21/2011   PLT 233 09/21/2011    Assessment / Plan: Arrest in active phase of labor  Labor: no progression with adequate labor Preeclampsia:  na Fetal Wellbeing:  Category I Pain Control:  Epidural I/D:  n/a Anticipated MOD:  Cesarean section. Risks and benefits discussed.  Kentrell Hallahan J 09/22/2011, 10:50 PM

## 2011-09-23 ENCOUNTER — Encounter (HOSPITAL_COMMUNITY): Payer: Self-pay

## 2011-09-23 LAB — CBC
Hemoglobin: 10.6 g/dL — ABNORMAL LOW (ref 12.0–15.0)
MCH: 31 pg (ref 26.0–34.0)
MCHC: 34.3 g/dL (ref 30.0–36.0)
MCV: 90.4 fL (ref 78.0–100.0)
RBC: 3.42 MIL/uL — ABNORMAL LOW (ref 3.87–5.11)

## 2011-09-23 MED ORDER — ONDANSETRON HCL 4 MG/2ML IJ SOLN: 4.0000 mg | INTRAMUSCULAR | Status: DC | PRN

## 2011-09-23 MED ORDER — DIPHENHYDRAMINE HCL 50 MG/ML IJ SOLN: 12.5000 mg | INTRAMUSCULAR | Status: DC | PRN

## 2011-09-23 MED ORDER — MEPERIDINE HCL 25 MG/ML IJ SOLN
INTRAMUSCULAR | Status: AC
  Filled 2011-09-23: qty 1

## 2011-09-23 MED ORDER — NALOXONE HCL 0.4 MG/ML IJ SOLN: 0.4000 mg | INTRAMUSCULAR | Status: DC | PRN

## 2011-09-23 MED ORDER — OXYTOCIN 20 UNITS IN LACTATED RINGERS INFUSION - SIMPLE
INTRAVENOUS | Status: AC
  Filled 2011-09-23: qty 1000

## 2011-09-23 MED ORDER — OXYTOCIN 20 UNITS IN LACTATED RINGERS INFUSION - SIMPLE: 125.0000 mL/h | INTRAVENOUS | Status: AC

## 2011-09-23 MED ORDER — ONDANSETRON HCL 4 MG PO TABS: 4.0000 mg | ORAL_TABLET | ORAL | Status: DC | PRN

## 2011-09-23 MED ORDER — DIPHENHYDRAMINE HCL 50 MG/ML IJ SOLN: 25.0000 mg | INTRAMUSCULAR | Status: DC | PRN

## 2011-09-23 MED ORDER — IBUPROFEN 600 MG PO TABS
600.0000 mg | ORAL_TABLET | Freq: Four times a day (QID) | ORAL | Status: DC | PRN
  Filled 2011-09-23 (×7): qty 1

## 2011-09-23 MED ORDER — SENNOSIDES-DOCUSATE SODIUM 8.6-50 MG PO TABS
2.0000 | ORAL_TABLET | Freq: Every day | ORAL | Status: DC
  Administered 2011-09-23 – 2011-09-24 (×2): 2 via ORAL

## 2011-09-23 MED ORDER — IBUPROFEN 600 MG PO TABS
600.0000 mg | ORAL_TABLET | Freq: Four times a day (QID) | ORAL | Status: DC
  Administered 2011-09-23 – 2011-09-25 (×10): 600 mg via ORAL
  Filled 2011-09-23 (×3): qty 1

## 2011-09-23 MED ORDER — NALBUPHINE HCL 10 MG/ML IJ SOLN
5.0000 mg | INTRAMUSCULAR | Status: DC | PRN
  Filled 2011-09-23: qty 1

## 2011-09-23 MED ORDER — ONDANSETRON HCL 4 MG/2ML IJ SOLN: 4.0000 mg | Freq: Three times a day (TID) | INTRAMUSCULAR | Status: DC | PRN

## 2011-09-23 MED ORDER — LANOLIN HYDROUS EX OINT: 1.0000 "application " | TOPICAL_OINTMENT | CUTANEOUS | Status: DC | PRN

## 2011-09-23 MED ORDER — HYDROMORPHONE HCL PF 1 MG/ML IJ SOLN
INTRAMUSCULAR | Status: AC
  Filled 2011-09-23: qty 1

## 2011-09-23 MED ORDER — SIMETHICONE 80 MG PO CHEW
80.0000 mg | CHEWABLE_TABLET | Freq: Three times a day (TID) | ORAL | Status: DC
  Administered 2011-09-23 – 2011-09-25 (×8): 80 mg via ORAL

## 2011-09-23 MED ORDER — SCOPOLAMINE 1 MG/3DAYS TD PT72
1.0000 | MEDICATED_PATCH | Freq: Once | TRANSDERMAL | Status: DC
  Administered 2011-09-23: 1.5 mg via TRANSDERMAL

## 2011-09-23 MED ORDER — METHYLERGONOVINE MALEATE 0.2 MG PO TABS: 0.2000 mg | ORAL_TABLET | ORAL | Status: DC | PRN

## 2011-09-23 MED ORDER — DIBUCAINE 1 % RE OINT: 1.0000 "application " | TOPICAL_OINTMENT | RECTAL | Status: DC | PRN

## 2011-09-23 MED ORDER — SODIUM CHLORIDE 0.9 % IJ SOLN: 3.0000 mL | INTRAMUSCULAR | Status: DC | PRN

## 2011-09-23 MED ORDER — HYDROMORPHONE HCL PF 1 MG/ML IJ SOLN
0.2500 mg | INTRAMUSCULAR | Status: DC | PRN
  Administered 2011-09-23 (×2): 0.5 mg via INTRAVENOUS

## 2011-09-23 MED ORDER — WITCH HAZEL-GLYCERIN EX PADS: 1.0000 "application " | MEDICATED_PAD | CUTANEOUS | Status: DC | PRN

## 2011-09-23 MED ORDER — KETOROLAC TROMETHAMINE 60 MG/2ML IM SOLN
60.0000 mg | Freq: Once | INTRAMUSCULAR | Status: AC | PRN
  Administered 2011-09-23: 60 mg via INTRAMUSCULAR

## 2011-09-23 MED ORDER — DIPHENHYDRAMINE HCL 25 MG PO CAPS: 25.0000 mg | ORAL_CAPSULE | ORAL | Status: DC | PRN

## 2011-09-23 MED ORDER — METHYLERGONOVINE MALEATE 0.2 MG/ML IJ SOLN: 0.2000 mg | INTRAMUSCULAR | Status: DC | PRN

## 2011-09-23 MED ORDER — METOCLOPRAMIDE HCL 5 MG/ML IJ SOLN: 10.0000 mg | Freq: Three times a day (TID) | INTRAMUSCULAR | Status: DC | PRN

## 2011-09-23 MED ORDER — OXYCODONE-ACETAMINOPHEN 5-325 MG PO TABS
1.0000 | ORAL_TABLET | ORAL | Status: DC | PRN
  Administered 2011-09-23 (×2): 1 via ORAL
  Administered 2011-09-23: 2 via ORAL
  Administered 2011-09-23 – 2011-09-24 (×3): 1 via ORAL
  Administered 2011-09-24: 2 via ORAL
  Administered 2011-09-24: 1 via ORAL
  Administered 2011-09-25 (×3): 2 via ORAL
  Filled 2011-09-23 (×3): qty 2
  Filled 2011-09-23: qty 1
  Filled 2011-09-23: qty 2
  Filled 2011-09-23: qty 1
  Filled 2011-09-23: qty 2
  Filled 2011-09-23 (×2): qty 1
  Filled 2011-09-23: qty 2
  Filled 2011-09-23: qty 1

## 2011-09-23 MED ORDER — LACTATED RINGERS IV SOLN: INTRAVENOUS | Status: DC

## 2011-09-23 MED ORDER — MENTHOL 3 MG MT LOZG: 1.0000 | LOZENGE | OROMUCOSAL | Status: DC | PRN

## 2011-09-23 MED ORDER — KETOROLAC TROMETHAMINE 60 MG/2ML IM SOLN
INTRAMUSCULAR | Status: AC
  Filled 2011-09-23: qty 2

## 2011-09-23 MED ORDER — SODIUM CHLORIDE 0.9 % IV SOLN
1.0000 ug/kg/h | INTRAVENOUS | Status: DC | PRN
  Filled 2011-09-23: qty 2.5

## 2011-09-23 MED ORDER — KETOROLAC TROMETHAMINE 30 MG/ML IJ SOLN: 30.0000 mg | Freq: Four times a day (QID) | INTRAMUSCULAR | Status: AC | PRN

## 2011-09-23 MED ORDER — DIPHENHYDRAMINE HCL 25 MG PO CAPS: 25.0000 mg | ORAL_CAPSULE | Freq: Four times a day (QID) | ORAL | Status: DC | PRN

## 2011-09-23 MED ORDER — PRENATAL MULTIVITAMIN CH
1.0000 | ORAL_TABLET | Freq: Every day | ORAL | Status: DC
  Administered 2011-09-23 – 2011-09-25 (×4): 1 via ORAL
  Filled 2011-09-23 (×3): qty 1

## 2011-09-23 MED ORDER — MEPERIDINE HCL 25 MG/ML IJ SOLN
6.2500 mg | INTRAMUSCULAR | Status: DC | PRN
  Administered 2011-09-23: 6.25 mg via INTRAVENOUS

## 2011-09-23 MED ORDER — SIMETHICONE 80 MG PO CHEW: 80.0000 mg | CHEWABLE_TABLET | ORAL | Status: DC | PRN

## 2011-09-23 MED ORDER — ZOLPIDEM TARTRATE 5 MG PO TABS: 5.0000 mg | ORAL_TABLET | Freq: Every evening | ORAL | Status: DC | PRN

## 2011-09-23 MED ORDER — TETANUS-DIPHTH-ACELL PERTUSSIS 5-2.5-18.5 LF-MCG/0.5 IM SUSP: 0.5000 mL | Freq: Once | INTRAMUSCULAR | Status: DC

## 2011-09-23 MED ORDER — SCOPOLAMINE 1 MG/3DAYS TD PT72
MEDICATED_PATCH | TRANSDERMAL | Status: AC
  Filled 2011-09-23: qty 1

## 2011-09-23 NOTE — Anesthesia Postprocedure Evaluation (Signed)
  Anesthesia Post-op Note  Patient: [name] Huang  Procedure(s) Performed: Procedure(s) (LRB): CESAREAN SECTION (N/A)   Patient is awake, responsive, moving her legs, and has signs of resolution of her numbness. Pain and nausea are reasonably well controlled. Vital signs are stable and clinically acceptable. Oxygen saturation is clinically acceptable. There are no apparent anesthetic complications at this time. Patient is ready for discharge.

## 2011-09-23 NOTE — Anesthesia Postprocedure Evaluation (Signed)
  Anesthesia Post-op Note  Patient: [name] Huang  Procedure(s) Performed: Procedure(s) (LRB): CESAREAN SECTION (N/A)  Patient Location: Mother/Baby  Anesthesia Type: Epidural  Level of Consciousness: awake  Airway and Oxygen Therapy: Patient Spontanous Breathing  Post-op Pain: none  Post-op Assessment: Patient's Cardiovascular Status Stable, Respiratory Function Stable, Patent Airway, No signs of Nausea or vomiting, Adequate PO intake, Pain level controlled, No headache, No backache, No residual numbness and No residual motor weakness  Post-op Vital Signs: Reviewed and stable  Complications: No apparent anesthesia complications

## 2011-09-23 NOTE — Transfer of Care (Signed)
Immediate Anesthesia Transfer of Care Note  Patient: [name] Huang  Procedure(s) Performed: Procedure(s) (LRB): CESAREAN SECTION (N/A)  Patient Location: PACU  Anesthesia Type: Epidural  Level of Consciousness: awake, alert  and oriented  Airway & Oxygen Therapy: Patient Spontanous Breathing  Post-op Assessment: Report given to PACU RN  Post vital signs: Reviewed and stable  Complications: No apparent anesthesia complications

## 2011-09-23 NOTE — Progress Notes (Signed)
Subjective: POD# 1 Information for the patient's newborn:  [name] Huang, [name] Huang [[id number]]  female  / circ planned  Reports feeling well, standing at [facility] during visit. Feeding: breast Patient reports tolerating PO.  Breast symptoms: none Pain controlled with motrin Denies HA/SOB/C/P/N/V/dizziness. Flatus absent. She reports vaginal bleeding as normal, without clots.  She is ambulating, urinating without difficult.     Objective:   VS:  Filed Vitals:   09/23/11 0330 09/23/11 0430 09/23/11 0528 09/23/11 0630  BP: 127/85 114/75 123/79 117/80  Pulse: 99 100 100 90  Temp: 98.7 F (37.1 C) 99 F (37.2 C) 99.3 F (37.4 C) 98.2 F (36.8 C)  TempSrc:      Resp: 20 18 20 20   Height:      Weight:      SpO2: 97% 99% 96% 97%     Intake/Output Summary (Last 24 hours) at 09/23/11 0955 Last data filed at 09/23/11 0700  Gross per 24 hour  Intake 3735.55 ml  Output   2775 ml  Net 960.55 ml        Basename 09/23/11 0535 09/21/11 2010  WBC 16.5* 10.6*  HGB 10.6* 12.5  HCT 30.9* 36.8  PLT 172 233     Blood type: A/Positive/-- (10/12 0000)  Rubella: Immune (10/12 0000)     Physical Exam:  General: alert, cooperative and no distress CV: Regular rate and rhythm Resp: clear Abdomen: mod gas distention, decreased bowel sounds  Incision: small amount dry serous drainage present and dressing to LST Uterine Fundus: firm, below umbilicus, nontender Lochia: minimal Ext: edema trace pedal and Homans sign is negative, no sign of DVT      Assessment/Plan: 36 y.o.   POD# 1.  s/p Cesarean Delivery.  Indications: failure to progress                Principal Problem:  *PP care - 1C/S 5/21  Doing well, stable.               Advance diet as tolerated D/C foley this PM Ambulate and warm PO fluids to increase gut motility Routine post-op care  Khaleed Holan 09/23/2011, 9:55 AM

## 2011-09-23 NOTE — Addendum Note (Signed)
Addendum  created 09/23/11 1432 by Suella Grove, CRNA   Modules edited:Notes Section

## 2011-09-24 MED ORDER — HYDROCHLOROTHIAZIDE 12.5 MG PO CAPS
12.5000 mg | ORAL_CAPSULE | Freq: Every day | ORAL | Status: DC
  Administered 2011-09-24 – 2011-09-25 (×2): 12.5 mg via ORAL
  Filled 2011-09-24 (×2): qty 1

## 2011-09-24 NOTE — Progress Notes (Signed)
Patient ID: [name] Huang, female   DOB: [birth year], 36 y.o.   MRN: [id number]   POSTOPERATIVE DAY # 2 S/P cesarean section   S:         Reports feeling well - painful edema and carpal tunnel in both wrists             Tolerating po intake / no  nausea / no  vomiting / + flatus / no  BM             Bleeding is light             Pain controlled with motrin and percocet             Up ad lib / ambulatory  Newborn breast feeding  / Circumcision- requests: DrTaavon   O:  A & O x 3              VS: Blood pressure 118/62, pulse 89, temperature 98.3 F (36.8 C), temperature source Oral, resp. rate 18, height 5\' 2"  (1.575 m), weight 94.802 kg (209 lb), SpO2 98.00%, unknown if currently breastfeeding.  Lungs: Clear and unlabored  Heart: regular rate and rhythm / no mumurs  Abdomen: soft, non-tender, non-distended, active bowel sounds, marked PUPPS rash             Fundus: firm, non-tender, Ueven             Dressing OFF             Incision:  approximated with staples / no erythema / no ecchymosis / no drainage  Perineum: no edema  Lochia: light  Extremities: 2+ edema, no calf pain or tenderness  A:        POD # 2 S/P cesarean section            Dependent edema / carpal tunnel  P:        Routine postoperative care              HCTZ 12.5 x 7 days - push water hydration             Recommend wrist splints x 2 weeks / discussed transition from carpal tunnel to tendonitis with BF - signs to call             Anticipate discharge tomorrow    Marlinda Mike 09/24/2011, 10:48 AM

## 2011-09-25 MED ORDER — HYDROCHLOROTHIAZIDE 12.5 MG PO CAPS: 12.5000 mg | ORAL_CAPSULE | Freq: Every day | ORAL | Status: DC

## 2011-09-25 MED ORDER — OXYCODONE-ACETAMINOPHEN 5-325 MG PO TABS: 1.0000 | ORAL_TABLET | Freq: Four times a day (QID) | ORAL | Status: AC | PRN

## 2011-09-25 MED ORDER — DOCUSATE SODIUM 100 MG PO CAPS: 100.0000 mg | ORAL_CAPSULE | Freq: Two times a day (BID) | ORAL | Status: DC | PRN

## 2011-09-25 MED ORDER — IBUPROFEN 600 MG PO TABS: 600.0000 mg | ORAL_TABLET | Freq: Four times a day (QID) | ORAL | Status: AC | PRN

## 2011-09-25 NOTE — Discharge Summary (Signed)
Obstetric Discharge Summary Reason for Admission: induction of labor and post dates, mild polyhydramnios and marginal cord insertion Prenatal Procedures: ultrasound Intrapartum Procedures: cesarean: low cervical, transverse Postpartum Procedures: none Complications-Operative and Postpartum: none Hemoglobin  Date Value Range Status  09/23/2011 10.6* 12.0-15.0 (g/dL) Final     HCT  Date Value Range Status  09/23/2011 30.9* 36.0-46.0 (%) Final    Physical Exam:  General: alert, cooperative and no distress Lochia: appropriate Uterine Fundus: firm Incision: healing well, no significant drainage, staples C/D/I DVT Evaluation: No evidence of DVT seen on physical exam.  Discharge Diagnoses: Term Pregnancy-delivered  Discharge Information: Date: 09/25/2011 Activity: pelvic rest Diet: routine Medications: Ibuprofen, Colace, Percocet and HCTZ Condition: stable Instructions: refer to practice specific booklet Discharge to: home Follow-up Information    Follow up with Dr Olivia Mackie in 6 weeks for routine pp visit and for staple removal 09/29/11 1100    Contact information:   - - - - [place] [address]          Newborn Data: Live born female  Birth Weight: 8 lb 0.4 oz (3640 g) APGAR: 9, 9  Home with mother.  Yissel Habermehl K 09/25/2011, 9:47 AM

## 2011-09-25 NOTE — Progress Notes (Signed)
Patient ID: [name] Huang, female   DOB: [birth year], 36 y.o.   MRN: [id number] POD # 3  Subjective: Pt reports feeling well and eager for d/c home/ Pain controlled with ibuprofen and percocet Tolerating po/Voiding without problems/ No n/v/Flatus pos Activity: out of bed and ambulate Bleeding is light Newborn info:  Information for the patient's newborn:  Zelpha, Messing [[id number]]  female  / circ complete/ Feeding: breast   Objective: VS: Blood pressure 124/85, pulse 102, temperature 98.6 F (37 C), temperature source Oral, resp. rate 18, height 5\' 2"  (1.575 m), weight 94.802 kg (209 lb), SpO2 98.00%, unknown if currently breastfeeding.   LABS:  Basename 09/23/11 0535  WBC 16.5*  HGB 10.6*  HCT 30.9*  PLT 172     Physical Exam:  General: alert, cooperative and no distress CV: Regular rate and rhythm Resp: clear Abdomen: soft, NT, BS x 4 quads; mod dependent edema; patchy, flat, red lesions c/w PUPPs Incision: healing well, well approximated, staples intact Uterine Fundus: firm, below umbilicus, nontender Lochia: minimal Ext: edema +3 pedal and pretib and Homans sign is negative, no sign of DVT    A/P: POD # 3/ G3P1021/ S/P C/Section d/t failure to progress Fluid retention/LE edema PUPPs; hydrocortisone prn Carpal tunnel; continue splints and notify if worsening Doing well and stable for discharge home RX's: Ibuprofen 600mg  po Q 6 hrs prn pain #30 Refill x 1 Percocet 5/325 1 - 2 tabs po every 6 hrs prn pain  #30 No refill HCTZ 12.5mg  po QD x 6 days Colace 100mg  po BID prn constipation Staple removal Tues 09/29/11 @ 1100   Signed: Demetrius Revel, MSN, [facility] 09/25/2011, 9:35 AM

## 2011-10-23 ENCOUNTER — Telehealth: Payer: Self-pay | Admitting: Gastroenterology

## 2011-10-23 NOTE — Telephone Encounter (Signed)
Informed pt I will let her know when I get Dr Norval Gable response. Pt stated understanding.

## 2011-10-23 NOTE — Telephone Encounter (Signed)
Per notes in [facility], pt had COLON 03/08/2006 and another 03/2004 in [place]. No other notes, sent for chart.

## 2011-10-26 NOTE — Telephone Encounter (Signed)
Dr Jarold Motto, can you review pt's chart to see when she needs a recall COLON? In your box. Thanks.

## 2011-11-19 NOTE — Telephone Encounter (Signed)
Dr Jarold Motto reviewed the chart and it's ok to order a Direct Colon per recall; pt has hx of adenomatous polyps.

## 2011-11-20 ENCOUNTER — Ambulatory Visit (AMBULATORY_SURGERY_CENTER): Payer: BC Managed Care – PPO | Admitting: *Deleted

## 2011-11-20 VITALS — Ht 62.0 in | Wt 174.6 lb

## 2011-11-20 DIAGNOSIS — Z86010 Personal history of colonic polyps: Secondary | ICD-10-CM

## 2011-11-20 DIAGNOSIS — Z1211 Encounter for screening for malignant neoplasm of colon: Secondary | ICD-10-CM

## 2011-11-20 MED ORDER — MOVIPREP 100 G PO SOLR: ORAL | Status: DC

## 2011-11-20 NOTE — Progress Notes (Signed)
Pt had baby 09/22/2011.  Is breastfeeding. She has appointment with pediatrician on Monday and will discuss when to resume breastfeeding post procedure.

## 2011-11-23 ENCOUNTER — Encounter: Payer: Self-pay | Admitting: Gastroenterology

## 2011-12-08 ENCOUNTER — Encounter: Payer: Self-pay | Admitting: Gastroenterology

## 2011-12-08 ENCOUNTER — Ambulatory Visit (AMBULATORY_SURGERY_CENTER): Payer: BC Managed Care – PPO | Admitting: Gastroenterology

## 2011-12-08 VITALS — BP 112/80 | HR 72 | Temp 98.0°F | Resp 22 | Ht 62.0 in | Wt 174.0 lb

## 2011-12-08 DIAGNOSIS — Z800 Family history of malignant neoplasm of digestive organs: Secondary | ICD-10-CM

## 2011-12-08 DIAGNOSIS — Z1211 Encounter for screening for malignant neoplasm of colon: Secondary | ICD-10-CM

## 2011-12-08 DIAGNOSIS — Z86010 Personal history of colonic polyps: Secondary | ICD-10-CM

## 2011-12-08 MED ORDER — SODIUM CHLORIDE 0.9 % IV SOLN: 500.0000 mL | INTRAVENOUS | Status: DC

## 2011-12-08 NOTE — Progress Notes (Signed)
Patient did not have preoperative order for IV antibiotic SSI prophylaxis. (G8918)  Patient did not experience any of the following events: a burn prior to discharge; a fall within the facility; wrong site/side/patient/procedure/implant event; or a hospital transfer or hospital admission upon discharge from the facility. (G8907)  

## 2011-12-08 NOTE — Patient Instructions (Addendum)
YOU HAD AN ENDOSCOPIC PROCEDURE TODAY AT THE [facility]: Refer to the procedure report that was given to you for any specific questions about what was found during the examination.  If the procedure report does not answer your questions, please call your gastroenterologist to clarify.  If you requested that your care partner not be given the details of your procedure findings, then the procedure report has been included in a sealed envelope for you to review at your convenience later.  YOU SHOULD EXPECT: Some feelings of bloating in the abdomen. Passage of more gas than usual.  Walking can help get rid of the air that was put into your GI tract during the procedure and reduce the bloating. If you had a lower endoscopy (such as a colonoscopy or flexible sigmoidoscopy) you may notice spotting of blood in your stool or on the toilet paper. If you underwent a bowel prep for your procedure, then you may not have a normal bowel movement for a few days.  DIET: Your first meal following the procedure should be a light meal and then it is ok to progress to your normal diet.  A half-sandwich or bowl of soup is an example of a good first meal.  Heavy or fried foods are harder to digest and may make you feel nauseous or bloated.  Likewise meals heavy in dairy and vegetables can cause extra gas to form and this can also increase the bloating.  Drink plenty of fluids but you should avoid alcoholic beverages for 24 hours.  ACTIVITY: Your care partner should take you home directly after the procedure.  You should plan to take it easy, moving slowly for the rest of the day.  You can resume normal activity the day after the procedure however you should NOT DRIVE or use heavy machinery for 24 hours (because of the sedation medicines used during the test).    SYMPTOMS TO REPORT IMMEDIATELY: A gastroenterologist can be reached at any hour.  During normal business hours, 8:30 AM to 5:00 PM Monday through Friday,  call [phone].  After hours and on weekends, please call the GI answering service at [phone] who will take a message and have the physician on call contact you.   Following lower endoscopy (colonoscopy or flexible sigmoidoscopy):  Excessive amounts of blood in the stool  Significant tenderness or worsening of abdominal pains  Swelling of the abdomen that is new, acute  Fever of 100F or higher    FOLLOW UP: If any biopsies were taken you will be contacted by phone or by letter within the next 1-3 weeks.  Call your gastroenterologist if you have not heard about the biopsies in 3 weeks.  Our staff will call the home number listed on your records the next business day following your procedure to check on you and address any questions or concerns that you may have at that time regarding the information given to you following your procedure. This is a courtesy call and so if there is no answer at the home number and we have not heard from you through the emergency physician on call, we will assume that you have returned to your regular daily activities without incident.  SIGNATURES/CONFIDENTIALITY: You and/or your care partner have signed paperwork which will be entered into your electronic medical record.  These signatures attest to the fact that that the information above on your After Visit Summary has been reviewed and is understood.  Full responsibility of the confidentiality   of this discharge information lies with you and/or your care-partner.     

## 2011-12-08 NOTE — Op Note (Signed)
[facility] [address] [place], [place]  [address]  COLONOSCOPY PROCEDURE REPORT  PATIENT:  [name] Huang, [name] Huang  MR#:  [id number] BIRTHDATE:  May 07, 1975, 35 yrs. old  GENDER:  female ENDOSCOPIST:  Vania Rea. Jarold Motto, MD, [facility] REF. BY: PROCEDURE DATE:  12/08/2011 PROCEDURE:  Diagnostic Colonoscopy ASA CLASS:  Class I INDICATIONS:  family history of colon cancer, history of polyps MEDICATIONS:   propofol (Diprivan) 200 mg IV  DESCRIPTION OF PROCEDURE:   After the risks and benefits and of the procedure were explained, informed consent was obtained. Digital rectal exam was performed and revealed no abnormalities. The LB [id number] endoscope was introduced through the anus and advanced to the cecum, which was identified by both the appendix and ileocecal valve.  The quality of the prep was excellent, using MoviPrep.  The instrument was then slowly withdrawn as the colon was fully examined. <<PROCEDUREIMAGES>>  FINDINGS:  No polyps or cancers were seen.  This was otherwise a normal examination of the colon.   Retroflexion was not performed. The scope was then withdrawn from the patient and the procedure completed.  COMPLICATIONS:  None ENDOSCOPIC IMPRESSION: 1) No polyps or cancers 2) Otherwise normal examination RECOMMENDATIONS: 1) Given your significant family history of colon cancer, you should have a repeat colonoscopy in 5 years  REPEAT EXAM:  No  ______________________________ Vania Rea. Jarold Motto, MD, [facility]  CC:  n. eSIGNED:   Vania Rea. Tkai Large at 12/08/2011 09:51 AM  Sherle Poe, [phone]

## 2011-12-09 ENCOUNTER — Telehealth: Payer: Self-pay

## 2011-12-09 NOTE — Telephone Encounter (Signed)
Left message on answering machine. 

## 2012-07-15 ENCOUNTER — Other Ambulatory Visit: Payer: Self-pay | Admitting: Occupational Medicine

## 2012-07-15 ENCOUNTER — Ambulatory Visit: Payer: Self-pay

## 2012-07-15 DIAGNOSIS — M79672 Pain in left foot: Secondary | ICD-10-CM

## 2013-03-17 ENCOUNTER — Other Ambulatory Visit: Payer: Self-pay | Admitting: Obstetrics and Gynecology

## 2013-03-21 ENCOUNTER — Encounter (HOSPITAL_COMMUNITY): Payer: Self-pay | Admitting: Pharmacist

## 2013-04-03 ENCOUNTER — Encounter (HOSPITAL_COMMUNITY): Payer: Self-pay

## 2013-04-03 ENCOUNTER — Encounter (HOSPITAL_COMMUNITY)
Admission: RE | Admit: 2013-04-03 | Discharge: 2013-04-03 | Disposition: A | Discharge status: Home / Self Care | Payer: BC Managed Care – PPO | Source: Ambulatory Visit | Attending: Obstetrics and Gynecology | Admitting: Obstetrics and Gynecology

## 2013-04-03 LAB — TYPE AND SCREEN
ABO/RH(D): A POS
Antibody Screen: NEGATIVE

## 2013-04-03 LAB — CBC
Hemoglobin: 12.1 g/dL (ref 12.0–15.0)
MCHC: 34.1 g/dL (ref 30.0–36.0)
Platelets: 251 10*3/uL (ref 150–400)
RBC: 4.14 MIL/uL (ref 3.87–5.11)

## 2013-04-03 NOTE — H&P (Signed)
[name] Huang, KRETSCH NO.:  [id number]  MEDICAL RECORD NO.:  [id number]  LOCATION:  PERIO                         FACILITY:  [facility]  PHYSICIAN:  Lenoard Aden, M.D.DATE OF BIRTH:  02/27/1976  DATE OF ADMISSION:  01/26/2013 DATE OF DISCHARGE:                             HISTORY & PHYSICAL   CHIEF COMPLAINT:  Previous C-section for repeat.  HISTORY OF PRESENT ILLNESS:  She is a 37 year old white female, G4, P1, who presents for repeat C-section at 11 and 6/7th weeks gestation.  She has allergies to AMOXICILLIN and CODEINE.  MEDICATIONS:  Prenatal vitamins, Zantac, albuterol as needed.  FAMILY HISTORY:  Breast cancer, heart disease, diabetes, hypertension, bipolar disorder, and varicosities.  SOCIAL HISTORY:  Noncontributory.  History of 2 spontaneous pregnancy losses and 1 previous C-section. Prenatal course uncomplicated.  PHYSICAL EXAMINATION:  GENERAL:  She is a well-developed, well- nourished, white female, in no acute distress. HEENT:  Normal. NECK:  Supple.  Full range of motion. LUNGS:  Clear. HEART:  Regular rhythm. ABDOMEN:  Soft, gravid, nontender.  Estimated fetal weight 8 pounds. Cervix is closed, 50% vertex, -2. EXTREMITIES:  There are no cords. NEUROLOGIC:  Nonfocal. SKIN:  Intact.  IMPRESSION:  A 39 and 6/7th week intrauterine pregnancy with previous C- section for repeat.  PLAN:  Proceed with elective repeat low segment transverse cesarean section.  Risks of anesthesia, infection, bleeding, injury to surrounding organs, need for repair is discussed, delayed versus immediate complications to include bowel and bladder injury noted.  The patient acknowledges and wishes to proceed.     Lenoard Aden, M.D.     RJT/MEDQ  D:  04/03/2013  T:  04/03/2013  Job:  [id number]

## 2013-04-03 NOTE — Patient Instructions (Signed)
20 [name] Huang  04/03/2013   Your procedure is scheduled on:  04/04/13  Enter through the Main Entrance of [facility] at 1130 AM.  Pick up the phone at the desk and dial [phone].   Call this number if you have problems the morning of surgery: [phone]   Remember:   Do not eat food:After Midnight.  Do not drink clear liquids: 4 Hours before arrival.  Take these medicines the morning of surgery with A SIP OF WATER: Zantac   Do not wear jewelry, make-up or nail polish.  Do not wear lotions, powders, or perfumes. You may wear deodorant.  Do not shave 48 hours prior to surgery.  Do not bring valuables to the hospital.  [facility] is not   responsible for any belongings or valuables brought to the hospital.  Contacts, dentures or bridgework may not be worn into surgery.  Leave suitcase in the car. After surgery it may be brought to your room.  For patients admitted to the hospital, checkout time is 11:00 AM the day of              discharge.   Patients discharged the day of surgery will not be allowed to drive             home.  Name and phone number of your driver: NA  Special Instructions:   Shower using CHG 2 nights before surgery and the night before surgery.  If you shower the day of surgery use CHG.  Use special wash - you have one bottle of CHG for all showers.  You should use approximately 1/3 of the bottle for each shower.   Please read over the following fact sheets that you were given:   Surgical Site Infection Prevention

## 2013-04-04 ENCOUNTER — Inpatient Hospital Stay (HOSPITAL_COMMUNITY)
Admission: AD | Admit: 2013-04-04 | Discharge: 2013-04-07 | DRG: 766 | Disposition: A | Discharge status: Home / Self Care | Payer: BC Managed Care – PPO | Source: Ambulatory Visit | Attending: Obstetrics and Gynecology | Admitting: Obstetrics and Gynecology

## 2013-04-04 ENCOUNTER — Encounter (HOSPITAL_COMMUNITY): Payer: BC Managed Care – PPO | Admitting: Anesthesiology

## 2013-04-04 ENCOUNTER — Encounter (HOSPITAL_COMMUNITY)
Admission: AD | Disposition: A | Discharge status: Home / Self Care | Payer: Self-pay | Source: Ambulatory Visit | Attending: Obstetrics and Gynecology

## 2013-04-04 ENCOUNTER — Encounter (HOSPITAL_COMMUNITY): Payer: Self-pay | Admitting: Anesthesiology

## 2013-04-04 ENCOUNTER — Inpatient Hospital Stay (HOSPITAL_COMMUNITY)
Admission: AD | Admit: 2013-04-04 | Payer: BC Managed Care – PPO | Source: Ambulatory Visit | Admitting: Obstetrics and Gynecology

## 2013-04-04 ENCOUNTER — Inpatient Hospital Stay (HOSPITAL_COMMUNITY): Payer: BC Managed Care – PPO | Admitting: Anesthesiology

## 2013-04-04 DIAGNOSIS — O34219 Maternal care for unspecified type scar from previous cesarean delivery: Principal | ICD-10-CM | POA: Diagnosis present

## 2013-04-04 DIAGNOSIS — F329 Major depressive disorder, single episode, unspecified: Secondary | ICD-10-CM | POA: Diagnosis present

## 2013-04-04 DIAGNOSIS — O26899 Other specified pregnancy related conditions, unspecified trimester: Secondary | ICD-10-CM | POA: Diagnosis not present

## 2013-04-04 DIAGNOSIS — L299 Pruritus, unspecified: Secondary | ICD-10-CM | POA: Diagnosis not present

## 2013-04-04 DIAGNOSIS — O99345 Other mental disorders complicating the puerperium: Secondary | ICD-10-CM | POA: Diagnosis present

## 2013-04-04 DIAGNOSIS — O09529 Supervision of elderly multigravida, unspecified trimester: Secondary | ICD-10-CM | POA: Diagnosis present

## 2013-04-04 DIAGNOSIS — F3289 Other specified depressive episodes: Secondary | ICD-10-CM | POA: Diagnosis present

## 2013-04-04 LAB — RPR: RPR Ser Ql: NONREACTIVE

## 2013-04-04 SURGERY — Surgical Case
Anesthesia: Spinal | Site: Abdomen

## 2013-04-04 MED ORDER — METOCLOPRAMIDE HCL 5 MG/ML IJ SOLN: 10.0000 mg | Freq: Three times a day (TID) | INTRAMUSCULAR | Status: DC | PRN

## 2013-04-04 MED ORDER — CEFAZOLIN SODIUM-DEXTROSE 2-3 GM-% IV SOLR
INTRAVENOUS | Status: AC
  Filled 2013-04-04: qty 50

## 2013-04-04 MED ORDER — LACTATED RINGERS IV SOLN
INTRAVENOUS | Status: DC
  Administered 2013-04-04: 19:00:00 via INTRAVENOUS

## 2013-04-04 MED ORDER — OXYTOCIN 40 UNITS IN LACTATED RINGERS INFUSION - SIMPLE MED: 62.5000 mL/h | INTRAVENOUS | Status: AC

## 2013-04-04 MED ORDER — DIPHENHYDRAMINE HCL 25 MG PO CAPS
25.0000 mg | ORAL_CAPSULE | ORAL | Status: DC | PRN
  Administered 2013-04-04 – 2013-04-05 (×2): 25 mg via ORAL
  Filled 2013-04-04 (×2): qty 1

## 2013-04-04 MED ORDER — NALOXONE HCL 1 MG/ML IJ SOLN
1.0000 ug/kg/h | INTRAVENOUS | Status: DC | PRN
  Filled 2013-04-04: qty 2

## 2013-04-04 MED ORDER — WITCH HAZEL-GLYCERIN EX PADS: 1.0000 "application " | MEDICATED_PAD | CUTANEOUS | Status: DC | PRN

## 2013-04-04 MED ORDER — BUPIVACAINE HCL (PF) 0.25 % IJ SOLN
INTRAMUSCULAR | Status: AC
  Filled 2013-04-04: qty 30

## 2013-04-04 MED ORDER — PRENATAL MULTIVITAMIN CH
1.0000 | ORAL_TABLET | Freq: Every day | ORAL | Status: DC
  Administered 2013-04-05 – 2013-04-07 (×3): 1 via ORAL
  Filled 2013-04-04 (×3): qty 1

## 2013-04-04 MED ORDER — OXYCODONE-ACETAMINOPHEN 5-325 MG PO TABS
1.0000 | ORAL_TABLET | ORAL | Status: DC | PRN
  Administered 2013-04-05 (×2): 2 via ORAL
  Filled 2013-04-04 (×2): qty 2

## 2013-04-04 MED ORDER — MEPERIDINE HCL 25 MG/ML IJ SOLN: 6.2500 mg | INTRAMUSCULAR | Status: DC | PRN

## 2013-04-04 MED ORDER — SCOPOLAMINE 1 MG/3DAYS TD PT72
MEDICATED_PATCH | TRANSDERMAL | Status: AC
  Filled 2013-04-04: qty 1

## 2013-04-04 MED ORDER — NALOXONE HCL 0.4 MG/ML IJ SOLN: 0.4000 mg | INTRAMUSCULAR | Status: DC | PRN

## 2013-04-04 MED ORDER — FENTANYL CITRATE 0.05 MG/ML IJ SOLN
INTRAMUSCULAR | Status: DC | PRN
  Administered 2013-04-04: 12.5 ug via INTRATHECAL

## 2013-04-04 MED ORDER — MORPHINE SULFATE 0.5 MG/ML IJ SOLN
INTRAMUSCULAR | Status: AC
  Filled 2013-04-04: qty 10

## 2013-04-04 MED ORDER — MORPHINE SULFATE (PF) 0.5 MG/ML IJ SOLN
INTRAMUSCULAR | Status: DC | PRN
  Administered 2013-04-04: .2 mg via INTRATHECAL

## 2013-04-04 MED ORDER — LACTATED RINGERS IV SOLN
INTRAVENOUS | Status: DC | PRN
  Administered 2013-04-04 (×2): via INTRAVENOUS

## 2013-04-04 MED ORDER — SIMETHICONE 80 MG PO CHEW
80.0000 mg | CHEWABLE_TABLET | Freq: Three times a day (TID) | ORAL | Status: DC
  Administered 2013-04-04 – 2013-04-07 (×9): 80 mg via ORAL
  Filled 2013-04-04 (×9): qty 1

## 2013-04-04 MED ORDER — SIMETHICONE 80 MG PO CHEW
80.0000 mg | CHEWABLE_TABLET | ORAL | Status: DC
  Administered 2013-04-05 – 2013-04-07 (×3): 80 mg via ORAL
  Filled 2013-04-04 (×3): qty 1

## 2013-04-04 MED ORDER — SCOPOLAMINE 1 MG/3DAYS TD PT72
1.0000 | MEDICATED_PATCH | Freq: Once | TRANSDERMAL | Status: DC
  Administered 2013-04-04: 1.5 mg via TRANSDERMAL

## 2013-04-04 MED ORDER — METHYLERGONOVINE MALEATE 0.2 MG/ML IJ SOLN: 0.2000 mg | INTRAMUSCULAR | Status: DC | PRN

## 2013-04-04 MED ORDER — FENTANYL CITRATE 0.05 MG/ML IJ SOLN
INTRAMUSCULAR | Status: AC
  Filled 2013-04-04: qty 2

## 2013-04-04 MED ORDER — ONDANSETRON HCL 4 MG/2ML IJ SOLN
INTRAMUSCULAR | Status: AC
  Filled 2013-04-04: qty 2

## 2013-04-04 MED ORDER — PHENYLEPHRINE 8 MG IN D5W 100 ML (0.08MG/ML) PREMIX OPTIME
INJECTION | INTRAVENOUS | Status: DC | PRN
  Administered 2013-04-04: 60 ug/min via INTRAVENOUS

## 2013-04-04 MED ORDER — ONDANSETRON HCL 4 MG/2ML IJ SOLN: 4.0000 mg | INTRAMUSCULAR | Status: DC | PRN

## 2013-04-04 MED ORDER — ONDANSETRON HCL 4 MG/2ML IJ SOLN
INTRAMUSCULAR | Status: DC | PRN
  Administered 2013-04-04: 4 mg via INTRAVENOUS

## 2013-04-04 MED ORDER — DIPHENHYDRAMINE HCL 25 MG PO CAPS
25.0000 mg | ORAL_CAPSULE | Freq: Four times a day (QID) | ORAL | Status: DC | PRN
  Filled 2013-04-04: qty 1

## 2013-04-04 MED ORDER — LACTATED RINGERS IV SOLN
INTRAVENOUS | Status: DC | PRN
  Administered 2013-04-04 (×5): via INTRAVENOUS

## 2013-04-04 MED ORDER — CEFAZOLIN SODIUM-DEXTROSE 2-3 GM-% IV SOLR
2.0000 g | INTRAVENOUS | Status: AC
  Administered 2013-04-04: 2 g via INTRAVENOUS

## 2013-04-04 MED ORDER — BUPIVACAINE IN DEXTROSE 0.75-8.25 % IT SOLN
INTRATHECAL | Status: DC | PRN
  Administered 2013-04-04: 1.4 mL via INTRATHECAL

## 2013-04-04 MED ORDER — METHYLERGONOVINE MALEATE 0.2 MG PO TABS: 0.2000 mg | ORAL_TABLET | ORAL | Status: DC | PRN

## 2013-04-04 MED ORDER — ONDANSETRON HCL 4 MG PO TABS: 4.0000 mg | ORAL_TABLET | ORAL | Status: DC | PRN

## 2013-04-04 MED ORDER — FENTANYL CITRATE 0.05 MG/ML IJ SOLN: 25.0000 ug | INTRAMUSCULAR | Status: DC | PRN

## 2013-04-04 MED ORDER — MENTHOL 3 MG MT LOZG: 1.0000 | LOZENGE | OROMUCOSAL | Status: DC | PRN

## 2013-04-04 MED ORDER — DIPHENHYDRAMINE HCL 50 MG/ML IJ SOLN: 25.0000 mg | INTRAMUSCULAR | Status: DC | PRN

## 2013-04-04 MED ORDER — ONDANSETRON HCL 4 MG/2ML IJ SOLN: 4.0000 mg | Freq: Three times a day (TID) | INTRAMUSCULAR | Status: DC | PRN

## 2013-04-04 MED ORDER — OXYTOCIN 10 UNIT/ML IJ SOLN
INTRAMUSCULAR | Status: DC | PRN
  Administered 2013-04-04: 40 [IU] via INTRAMUSCULAR

## 2013-04-04 MED ORDER — DIBUCAINE 1 % RE OINT: 1.0000 "application " | TOPICAL_OINTMENT | RECTAL | Status: DC | PRN

## 2013-04-04 MED ORDER — IBUPROFEN 600 MG PO TABS
600.0000 mg | ORAL_TABLET | Freq: Four times a day (QID) | ORAL | Status: DC
  Administered 2013-04-05 – 2013-04-07 (×10): 600 mg via ORAL
  Filled 2013-04-04 (×10): qty 1

## 2013-04-04 MED ORDER — TETANUS-DIPHTH-ACELL PERTUSSIS 5-2.5-18.5 LF-MCG/0.5 IM SUSP: 0.5000 mL | Freq: Once | INTRAMUSCULAR | Status: DC

## 2013-04-04 MED ORDER — OXYTOCIN 10 UNIT/ML IJ SOLN
INTRAMUSCULAR | Status: AC
  Filled 2013-04-04: qty 4

## 2013-04-04 MED ORDER — SENNOSIDES-DOCUSATE SODIUM 8.6-50 MG PO TABS
2.0000 | ORAL_TABLET | ORAL | Status: DC
  Administered 2013-04-05 – 2013-04-07 (×3): 2 via ORAL
  Filled 2013-04-04 (×3): qty 2

## 2013-04-04 MED ORDER — SODIUM CHLORIDE 0.9 % IJ SOLN: 3.0000 mL | INTRAMUSCULAR | Status: DC | PRN

## 2013-04-04 MED ORDER — BUPIVACAINE HCL (PF) 0.25 % IJ SOLN
INTRAMUSCULAR | Status: DC | PRN
  Administered 2013-04-04: 10 mL

## 2013-04-04 MED ORDER — PROMETHAZINE HCL 25 MG/ML IJ SOLN: 6.2500 mg | INTRAMUSCULAR | Status: DC | PRN

## 2013-04-04 MED ORDER — KETOROLAC TROMETHAMINE 30 MG/ML IJ SOLN
30.0000 mg | Freq: Four times a day (QID) | INTRAMUSCULAR | Status: AC | PRN
  Administered 2013-04-04: 30 mg via INTRAMUSCULAR

## 2013-04-04 MED ORDER — SIMETHICONE 80 MG PO CHEW: 80.0000 mg | CHEWABLE_TABLET | ORAL | Status: DC | PRN

## 2013-04-04 MED ORDER — DIPHENHYDRAMINE HCL 50 MG/ML IJ SOLN
12.5000 mg | INTRAMUSCULAR | Status: DC | PRN
  Administered 2013-04-04: 12.5 mg via INTRAVENOUS
  Filled 2013-04-04: qty 1

## 2013-04-04 MED ORDER — IBUPROFEN 600 MG PO TABS: 600.0000 mg | ORAL_TABLET | Freq: Four times a day (QID) | ORAL | Status: DC | PRN

## 2013-04-04 MED ORDER — KETOROLAC TROMETHAMINE 30 MG/ML IJ SOLN
30.0000 mg | Freq: Four times a day (QID) | INTRAMUSCULAR | Status: AC | PRN
  Administered 2013-04-04: 30 mg via INTRAVENOUS
  Filled 2013-04-04: qty 1

## 2013-04-04 MED ORDER — KETOROLAC TROMETHAMINE 30 MG/ML IJ SOLN
INTRAMUSCULAR | Status: AC
  Administered 2013-04-04: 30 mg via INTRAVENOUS
  Filled 2013-04-04: qty 1

## 2013-04-04 MED ORDER — LANOLIN HYDROUS EX OINT: 1.0000 "application " | TOPICAL_OINTMENT | CUTANEOUS | Status: DC | PRN

## 2013-04-04 MED ORDER — LACTATED RINGERS IV SOLN
INTRAVENOUS | Status: DC
  Administered 2013-04-04: 13:00:00 via INTRAVENOUS

## 2013-04-04 MED ORDER — LACTATED RINGERS IV SOLN
Freq: Once | INTRAVENOUS | Status: AC
  Administered 2013-04-04: 12:00:00 via INTRAVENOUS

## 2013-04-04 MED ORDER — KETOROLAC TROMETHAMINE 30 MG/ML IJ SOLN: 15.0000 mg | Freq: Once | INTRAMUSCULAR | Status: DC | PRN

## 2013-04-04 MED ORDER — ZOLPIDEM TARTRATE 5 MG PO TABS: 5.0000 mg | ORAL_TABLET | Freq: Every evening | ORAL | Status: DC | PRN

## 2013-04-04 SURGICAL SUPPLY — 37 items
ADH SKN CLS LQ APL DERMABOND (GAUZE/BANDAGES/DRESSINGS) ×1
CLAMP CORD UMBIL (MISCELLANEOUS) ×1 IMPLANT
CLOTH BEACON ORANGE TIMEOUT ST (SAFETY) ×2 IMPLANT
CONTAINER PREFILL 10% NBF 15ML (MISCELLANEOUS) IMPLANT
DERMABOND ADHESIVE PROPEN (GAUZE/BANDAGES/DRESSINGS) ×1
DERMABOND ADVANCED .7 DNX6 (GAUZE/BANDAGES/DRESSINGS) IMPLANT
DRAPE LG THREE QUARTER DISP (DRAPES) IMPLANT
DRSG OPSITE POSTOP 4X10 (GAUZE/BANDAGES/DRESSINGS) ×2 IMPLANT
DURAPREP 26ML APPLICATOR (WOUND CARE) ×2 IMPLANT
ELECT REM PT RETURN 9FT ADLT (ELECTROSURGICAL) ×2
ELECTRODE REM PT RTRN 9FT ADLT (ELECTROSURGICAL) ×1 IMPLANT
EXTRACTOR VACUUM M CUP 4 TUBE (SUCTIONS) IMPLANT
GLOVE BIO SURGEON STRL SZ7 (GLOVE) ×1 IMPLANT
GLOVE BIO SURGEON STRL SZ7.5 (GLOVE) ×2 IMPLANT
GLOVE BIOGEL PI IND STRL 7.0 (GLOVE) IMPLANT
GLOVE BIOGEL PI INDICATOR 7.0 (GLOVE) ×3
GOWN PREVENTION PLUS XLARGE (GOWN DISPOSABLE) ×2 IMPLANT
GOWN STRL REIN XL XLG (GOWN DISPOSABLE) ×2 IMPLANT
KIT ABG SYR 3ML LUER SLIP (SYRINGE) IMPLANT
NDL HYPO 25X1 1.5 SAFETY (NEEDLE) ×1 IMPLANT
NDL HYPO 25X5/8 SAFETYGLIDE (NEEDLE) IMPLANT
NEEDLE HYPO 25X1 1.5 SAFETY (NEEDLE) ×2 IMPLANT
NEEDLE HYPO 25X5/8 SAFETYGLIDE (NEEDLE) IMPLANT
NS IRRIG 1000ML POUR BTL (IV SOLUTION) ×2 IMPLANT
PACK C SECTION WH (CUSTOM PROCEDURE TRAY) ×2 IMPLANT
STAPLER VISISTAT 35W (STAPLE) ×2 IMPLANT
SUT MNCRL 0 VIOLET CTX 36 (SUTURE) ×2 IMPLANT
SUT MON AB 2-0 CT1 27 (SUTURE) ×2 IMPLANT
SUT MON AB-0 CT1 36 (SUTURE) ×4 IMPLANT
SUT MONOCRYL 0 CTX 36 (SUTURE) ×2
SUT PLAIN 0 NONE (SUTURE) IMPLANT
SUT PLAIN 2 0 XLH (SUTURE) IMPLANT
SUT VIC AB 4-0 KS 27 (SUTURE) ×1 IMPLANT
SYR CONTROL 10ML LL (SYRINGE) ×2 IMPLANT
TOWEL OR 17X24 6PK STRL BLUE (TOWEL DISPOSABLE) ×2 IMPLANT
TRAY FOLEY CATH 14FR (SET/KITS/TRAYS/PACK) ×2 IMPLANT
WATER STERILE IRR 1000ML POUR (IV SOLUTION) ×2 IMPLANT

## 2013-04-04 NOTE — Anesthesia Postprocedure Evaluation (Signed)
Anesthesia Post Note  Patient: [name] Huang  Procedure(s) Performed: Procedure(s) (LRB): Repeat CESAREAN SECTION (N/A)  Anesthesia type: Spinal  Patient location: PACU  Post pain: Pain level controlled  Post assessment: Post-op Vital signs reviewed  Last Vitals:  Filed Vitals:   04/04/13 1414  BP: 89/67  Pulse:   Temp: 36.3 C  Resp:     Post vital signs: Reviewed  Level of consciousness: awake  Complications: No apparent anesthesia complications

## 2013-04-04 NOTE — Op Note (Signed)
Cesarean Section Procedure Note  Indications: previous uterine incision Kerr x one  Pre-operative Diagnosis: 39 week 6 day pregnancy.  Post-operative Diagnosis: same  Surgeon: Lenoard Aden   Assistants: Arita Miss, CNM  Anesthesia: Spinal anesthesia  ASA Class: 2  Procedure Details  The patient was seen in the Holding Room. The risks, benefits, complications, treatment options, and expected outcomes were discussed with the patient.  The patient concurred with the proposed plan, giving informed consent. The risks of anesthesia, infection, bleeding and possible injury to other organs discussed. Injury to bowel, bladder, or ureter with possible need for repair discussed. Possible need for transfusion with secondary risks of hepatitis or HIV acquisition discussed. Post operative complications to include but not limited to DVT, PE and Pneumonia noted. The site of surgery properly noted/marked. The patient was taken to Operating Room # 2, identified as Rhina Kramme and the procedure verified as C-Section Delivery. A Time Out was held and the above information confirmed.  After induction of anesthesia, the patient was draped and prepped in the usual sterile manner. A Pfannenstiel incision was made and carried down through the subcutaneous tissue to the fascia. Fascial incision was made and extended transversely using Mayo scissors. The fascia was separated from the underlying rectus tissue superiorly and inferiorly. The peritoneum was identified and entered. Peritoneal incision was extended longitudinally. The utero-vesical peritoneal reflection was incised transversely and the bladder flap was bluntly freed from the lower uterine segment. A low transverse uterine incision(Kerr hysterotomy) was made. Delivered from OA presentation was a  female with Apgar scores of 9 at one minute and 9 at five minutes. Bulb suctioning gently performed. Neonatal team in attendance.After the umbilical cord was clamped  and cut cord blood was obtained for evaluation. The placenta was removed intact and appeared normal. The uterus was curetted with a dry lap pack. Good hemostasis was noted.The uterine outline, tubes and ovaries appeared normal. The uterine incision was closed with running locked sutures of 0 Monocryl x 2 layers. Hemostasis was observed. Lavage was carried out until clear.The parietal peritoneum was closed with a running 2-0 Monocryl suture. The fascia was then reapproximated with running sutures of 0 Monocryl. 2-0 plain to close [place] layer. The skin was reapproximated with 4-0 vicryl.  Instrument, sponge, and needle counts were correct prior the abdominal closure and at the conclusion of the case.   Findings: As above  Estimated Blood Loss:  300 mL         Drains: foley                 Specimens: placenta                 Complications:  None; patient tolerated the procedure well.         Disposition: PACU - hemodynamically stable.         Condition: stable  Attending Attestation: I performed the procedure.

## 2013-04-04 NOTE — Anesthesia Procedure Notes (Signed)
Spinal  Patient location during procedure: OR Start time: 04/04/2013 1:13 PM End time: 04/04/2013 1:16 PM Staffing Anesthesiologist: Leilani Able Performed by: anesthesiologist  Preanesthetic Checklist Completed: patient identified, surgical consent, pre-op evaluation, timeout performed, IV checked, risks and benefits discussed and monitors and equipment checked Spinal Block Patient position: sitting Prep: DuraPrep Patient monitoring: heart rate, cardiac monitor, continuous pulse ox and blood pressure Approach: midline Location: L3-4 Injection technique: single-shot Needle Needle type: Sprotte  Needle gauge: 24 G Needle length: 9 cm Needle insertion depth: 6 cm Assessment Sensory level: T4

## 2013-04-04 NOTE — Consult Note (Signed)
Neonatology Note:  Attendance at C-section:  I was asked by Dr. Taavon to attend this repeat C/S at term. The mother is a G4P1A2 A pos, GBS neg with IBS. ROM at delivery, fluid clear. Infant vigorous with good spontaneous cry and tone. Needed only minimal bulb suctioning. Ap 9/10. Lungs clear to ausc in DR. To CN to care of Pediatrician.  [name] Janeway C. Pilar Westergaard, MD  

## 2013-04-04 NOTE — Transfer of Care (Signed)
Immediate Anesthesia Transfer of Care Note  Patient: [name] Huang  Procedure(s) Performed: Procedure(s) with comments: Repeat CESAREAN SECTION (N/A) - EDD: 04/05/13  Patient Location: PACU  Anesthesia Type:Spinal  Level of Consciousness: awake, alert , oriented and patient cooperative  Airway & Oxygen Therapy: Patient Spontanous Breathing  Post-op Assessment: Report given to PACU RN and Post -op Vital signs reviewed and stable  Post vital signs: Reviewed and stable  Complications: No apparent anesthesia complications

## 2013-04-04 NOTE — H&P (Deleted)
NAMEPEARLY, BARTOSIK NO.:  [id number]  MEDICAL RECORD NO.:  [id number]  LOCATION:  [facility]                          FACILITY:  [facility]  PHYSICIAN:  Lenoard Aden, M.D.DATE OF BIRTH:  Feb 25, 1976  DATE OF ADMISSION:  04/04/2013 DATE OF DISCHARGE:                             HISTORY & PHYSICAL   CHIEF COMPLAINT:  Previous C-section for repeat.  HISTORY OF PRESENT ILLNESS:  DICTATION ENDS HERE.     Lenoard Aden, M.D.     RJT/MEDQ  D:  04/03/2013  T:  04/04/2013  Job:  [id number]

## 2013-04-04 NOTE — Lactation Note (Signed)
This note was copied from the chart of [name] Huang. Lactation Consultation Note  Patient Name: [name] Huang [id number] Date: 04/04/2013 Reason for consult: Initial assessment of this second-time mother and her newborn, just 6 hours of age.  Baby had nursed for 20 minutes at 1850 and is sound asleep in [place], while mom returning from ambulation and getting back to bed with assistance of NT staff.; per RN, Misty Stanley, this mom breastfed and pumped for her 84 month old for 11 months and LC briefly reviewed [facility] Resource brochure and website resources, as well as support group information.  Mom will need reinforcement later, with LC follow-up visit tomorrow.     Maternal Data Formula Feeding for Exclusion: No Infant to breast within first hour of birth: Yes (only nursed for 5 minutes but later nursed well) Has patient been taught Hand Expression?:  (not yet, per RN on pp unit) Does the patient have breastfeeding experience prior to this delivery?: Yes  Feeding Feeding Type: Breast Fed Length of feed: 20 min  LATCH Score/Interventions Latch: Grasps breast easily, tongue down, lips flanged, rhythmical sucking.  Audible Swallowing: A few with stimulation  Type of Nipple: Everted at rest and after stimulation  Comfort (Breast/Nipple): Soft / non-tender     Hold (Positioning): No assistance needed to correctly position infant at breast.  LATCH Score: 9 (previous feeding assessment by RN)  Lactation Tools Discussed/Used   Cue feedings, STS  Consult Status Consult Status: Follow-up Date: 04/05/13 Follow-up type: In-patient    [name] [facility] 04/04/2013, 8:28 PM

## 2013-04-04 NOTE — Progress Notes (Signed)
Patient ID: [name] Huang, female   DOB: 04/21/1976, 37 y.o.   MRN: [id number] Patient seen and examined. Consent witnessed and signed. No changes noted. Update completed. CBC    Component Value Date/Time   WBC 11.2* 04/03/2013 1600   RBC 4.14 04/03/2013 1600   HGB 12.1 04/03/2013 1600   HCT 35.5* 04/03/2013 1600   PLT 251 04/03/2013 1600   MCV 85.7 04/03/2013 1600   MCH 29.2 04/03/2013 1600   MCHC 34.1 04/03/2013 1600   RDW 13.5 04/03/2013 1600

## 2013-04-04 NOTE — Anesthesia Preprocedure Evaluation (Signed)
Anesthesia Evaluation  Patient identified by MRN, date of birth, ID band Patient awake    Reviewed: Allergy & Precautions, H&P , NPO status , Patient's Chart, lab work & pertinent test results  Airway Mallampati: II TM Distance: >3 FB Neck ROM: full    Dental no notable dental hx.    Pulmonary former smoker,    Pulmonary exam normal       Cardiovascular negative cardio ROS      Neuro/Psych negative psych ROS   GI/Hepatic negative GI ROS, Neg liver ROS,   Endo/Other  Morbid obesity  Renal/GU   negative genitourinary   Musculoskeletal negative musculoskeletal ROS (+)   Abdominal (+) + obese,   Peds  Hematology negative hematology ROS (+)   Anesthesia Other Findings   Reproductive/Obstetrics (+) Pregnancy                           Anesthesia Physical Anesthesia Plan  ASA: III  Anesthesia Plan: Spinal   Post-op Pain Management:    Induction:   Airway Management Planned:   Additional Equipment:   Intra-op Plan:   Post-operative Plan:   Informed Consent: I have reviewed the patients History and Physical, chart, labs and discussed the procedure including the risks, benefits and alternatives for the proposed anesthesia with the patient or authorized representative who has indicated his/her understanding and acceptance.     Plan Discussed with: CRNA and Surgeon  Anesthesia Plan Comments:         Anesthesia Quick Evaluation

## 2013-04-05 ENCOUNTER — Encounter (HOSPITAL_COMMUNITY): Payer: Self-pay | Admitting: Obstetrics and Gynecology

## 2013-04-05 LAB — CBC
MCHC: 34.2 g/dL (ref 30.0–36.0)
Platelets: 206 10*3/uL (ref 150–400)
RDW: 13.5 % (ref 11.5–15.5)
WBC: 12.4 10*3/uL — ABNORMAL HIGH (ref 4.0–10.5)

## 2013-04-05 LAB — BIRTH TISSUE RECOVERY COLLECTION (PLACENTA DONATION)

## 2013-04-05 LAB — OB RESULTS CONSOLE HIV ANTIBODY (ROUTINE TESTING): HIV: NONREACTIVE

## 2013-04-05 MED ORDER — SERTRALINE HCL 50 MG PO TABS
50.0000 mg | ORAL_TABLET | Freq: Every day | ORAL | Status: DC
  Administered 2013-04-06 – 2013-04-07 (×2): 50 mg via ORAL
  Filled 2013-04-05 (×2): qty 1

## 2013-04-05 MED ORDER — HYDROMORPHONE HCL 2 MG PO TABS
2.0000 mg | ORAL_TABLET | ORAL | Status: DC | PRN
  Administered 2013-04-05: 2 mg via ORAL
  Administered 2013-04-05 – 2013-04-06 (×4): 4 mg via ORAL
  Administered 2013-04-06: 2 mg via ORAL
  Administered 2013-04-06 – 2013-04-07 (×3): 4 mg via ORAL
  Administered 2013-04-07: 2 mg via ORAL
  Filled 2013-04-05 (×3): qty 2
  Filled 2013-04-05: qty 1
  Filled 2013-04-05 (×2): qty 2
  Filled 2013-04-05: qty 1
  Filled 2013-04-05: qty 2
  Filled 2013-04-05: qty 1
  Filled 2013-04-05: qty 2

## 2013-04-05 NOTE — Anesthesia Postprocedure Evaluation (Signed)
  Anesthesia Post-op Note  Patient: [name] Huang  Procedure(s) Performed: Procedure(s) with comments: Repeat CESAREAN SECTION (N/A) - EDD: 04/05/13  Patient Location: PACU and Mother/Baby  Anesthesia Type:Spinal  Level of Consciousness: awake, alert  and oriented  Airway and Oxygen Therapy: Patient Spontanous Breathing  Post-op Pain: mild  Post-op Assessment: Patient's Cardiovascular Status Stable, Respiratory Function Stable, No signs of Nausea or vomiting and Pain level controlled  Post-op Vital Signs: stable  Complications: No apparent anesthesia complications

## 2013-04-05 NOTE — Progress Notes (Signed)
Pt stated that at 2130 she went to the bath room and sat and heard a pop. She was concerned that a suture popped. Rn did fundal check and looked at honeycomb small amount of bleeding the size of 1cm x1cm noted nothing coming under the honeycomb at all. Instructed pt to call Rn if bleeding increased and reported this to oncoming RN. Instructed pt to inform MD when rounds in am.

## 2013-04-05 NOTE — Progress Notes (Addendum)
POD # 1  Subjective: Pt reports feeling good/ Pain controlled with Motrin and long-acting narcotic Tolerating po/ Foley d/c'd and voiding without problems/ No n/v/ Flatus present Activity: ad lib Bleeding is light Newborn info:  Information for the patient's newborn:  Jillienne, Egner Girl Leilynn [[id number]]  female Feeding: breast Requesting to start med for h/o postpartum depression  Objective:  VS:  Filed Vitals:   04/05/13 0300 04/05/13 0400 04/05/13 0500 04/05/13 0600  BP:  99/79    Pulse:  80    Temp:  98.6 F (37 C)    TempSrc:  Oral    Resp:  18    Height:      Weight:      SpO2: 99% 97% 98% 97%     I&O: Intake/Output     12/02 0701 - 12/03 0700 12/03 0701 - 12/04 0700   P.O. 810    I.V. (mL/kg) 4087.9 (44)    Total Intake(mL/kg) 4897.9 (52.7)    Urine (mL/kg/hr) 4700    Blood 500    Total Output 5200     Net -302.1          Urine Occurrence 2 x       Recent Labs  04/03/13 1600 04/05/13 0605  WBC 11.2* 12.4*  HGB 12.1 10.3*  HCT 35.5* 30.1*  PLT 251 206    Blood type: --/--/A POS, A POS (12/01 1600) Rubella:      Physical Exam:  General: alert and cooperative CV: Regular rate and rhythm Resp: CTA bilaterally Abdomen: soft, nontender, normal bowel sounds Incision: healing well, no drainage, no erythema, no hernia, no seroma, no swelling, well approximated, honeycomb dsg c/d/i Uterine Fundus: firm, below umbilicus, nontender Lochia: minimal Ext: extremities normal, atraumatic, no cyanosis or edema and Homans sign is negative, no sign of DVT    Assessment: POD # 1/ G4P2022/ S/P C/Section d/t repeat Doing well  Plan: Ambulate Will check with Dr. Billy Coast for antidepressant recommendation Continue routine post op orders   Signed: Donette Larry, Dorris Carnes, MSN, CNM 04/05/2013, 8:33 AM  Addendum: Will restart Zoloft 50 mg tomorrow am.

## 2013-04-06 NOTE — Progress Notes (Signed)
Patient ID: Peightyn Roberson, female   DOB: 1975-05-23, 37 y.o.   MRN: [id number] Subjective: POD# 2 Information for the patient's newborn:  Ross, Hefferan Girl Deah [[id number]]  female  Reports feeling well Feeding: breast Patient reports tolerating PO.  Breast symptoms: none Pain controlled with ibuprofen (OTC) and narcotic analgesics including hydromorphone (Dilaudid) Denies HA/SOB/C/P/N/V/dizziness. Flatus absent - increased abdominal pain from gas. She reports vaginal bleeding as normal, without clots.  She is ambulating, urinating without difficult.     Objective:   VS:  Filed Vitals:   04/05/13 0900 04/05/13 1330 04/05/13 1855 04/06/13 0546  BP: 111/71 100/65 120/84 116/80  Pulse: 87 88 89 87  Temp: 98.1 F (36.7 C) 98.3 F (36.8 C) 98 F (36.7 C) 97.7 F (36.5 C)  TempSrc: Oral Oral Oral Oral  Resp: 18 16 18 18   Height:      Weight:      SpO2: 98% 98% 99%     No intake or output data in the 24 hours ending 04/06/13 1456      Recent Labs  04/03/13 1600 04/05/13 0605  WBC 11.2* 12.4*  HGB 12.1 10.3*  HCT 35.5* 30.1*  PLT 251 206     Blood type: --/--/A POS, A POS (12/01 1600)  Rubella:       Physical Exam:  General: alert, cooperative and no distress Abdomen: soft, nontender, normal bowel sounds Incision: Tegaderm dressing intact, bloody drainage present on Honeycomb dressing - borders marked    Small blood clot noted in middle of incision line Uterine Fundus: firm, below umbilicus, nontender Lochia: minimal Ext: edema trace, Homans sign is negative, no sign of DVT, redness or tenderness in the calves or thighs      Assessment/Plan: 37 y.o.   POD# 2.  s/p Cesarean Delivery.  Indications: repeat                Principal Problem:   Postpartum care following cesarean delivery (12/2)  Doing well, stable.               Regular diet as tolerated Encourage increased ambulate Routine post-op care Anticipate discharge home tomorrow - plan incision check  Tuesday 04/11/2013  Raelyn Mora, Judie Petit, MSN, CNM 04/06/2013, 2:56 PM

## 2013-04-06 NOTE — Lactation Note (Signed)
This note was copied from the chart of [name] Kynslei Art. Lactation Consultation Note  Patient Name: [name] Huang [id number] Date: 04/06/2013 Reason for consult: Follow-up assessment Parents concerned about baby's weight loss (8%). Baby had recently fed in the last hour for 40 minutes and asleep at this visit. Mom is using cross cradle and describes a good latch, she reports hearing some swallows. Baby has had 12 voids and 12 stools since birth. Mom reports feeling some mild breast changes. Reassured parents that all looks well in the chart and sounds well from their report. Advised parents baby should be at the breast 8-12 times or more in 24 hours, keep baby actively nursing for 15-30 minutes. Cluster feeding discussed. Encouraged to ask for assist as needed. Will follow up tomorrow.   Maternal Data    Feeding Feeding Type: Breast Fed Length of feed: 10 min  LATCH Score/Interventions Latch: Grasps breast easily, tongue down, lips flanged, rhythmical sucking. Intervention(s): Adjust position  Audible Swallowing: A few with stimulation Intervention(s): Hand expression;Skin to skin Intervention(s): Skin to skin  Type of Nipple: Everted at rest and after stimulation  Comfort (Breast/Nipple): Soft / non-tender     Hold (Positioning): No assistance needed to correctly position infant at breast.  LATCH Score: 9  Lactation Tools Discussed/Used     Consult Status Consult Status: Follow-up Date: 04/07/13 Follow-up type: In-patient    Alfred Levins 04/06/2013, 5:28 PM

## 2013-04-07 MED ORDER — HYDROMORPHONE HCL 2 MG PO TABS: 2.0000 mg | ORAL_TABLET | ORAL | Status: DC | PRN

## 2013-04-07 MED ORDER — SERTRALINE HCL 50 MG PO TABS: 50.0000 mg | ORAL_TABLET | Freq: Every day | ORAL | Status: DC

## 2013-04-07 MED ORDER — SENNOSIDES-DOCUSATE SODIUM 8.6-50 MG PO TABS: 2.0000 | ORAL_TABLET | Freq: Every evening | ORAL | Status: DC | PRN

## 2013-04-07 MED ORDER — IBUPROFEN 600 MG PO TABS: 600.0000 mg | ORAL_TABLET | Freq: Four times a day (QID) | ORAL | Status: DC | PRN

## 2013-04-07 NOTE — Progress Notes (Signed)
Patient ID: [name] Huang, female   DOB: 08/12/1975, 37 y.o.   MRN: [id number] POD # 3  Subjective: Pt reports feeling well and eager for d/c home/ Pain controlled with ibuprofen and dilaudid Tolerating po/Voiding without problems/ No n/v/Flatus pos Activity: out of bed and ambulate Bleeding is light Newborn info:  Information for the patient's newborn:  [name] Huang, [name] Huang Girl [name] Huang [[id number]]  female Feeding: breast   Objective: VS: Blood pressure 118/81, pulse 78, temperature 97.2 F (36.2 C), temperature source Oral, resp. rate 18  LABS:  Recent Labs  04/05/13 0605  WBC 12.4*  HGB 10.3*  PLT 206    Rubella: Immune (per review of OB chart)    Physical Exam:  General: alert, cooperative and no distress CV: Regular rate and rhythm Resp: clear Abdomen: soft, nontender, normal bowel sounds; faint flat macular rash c/w PUPPS, pruritic Incision: Tegaderm and honeycomb dressing removed; well approximated with subcuticular closure Uterine Fundus: firm, below umbilicus, nontender Lochia: minimal Ext: edema +1 and Homans sign is negative, no sign of DVT    A/P: POD # 3/ G4P2022/ S/P Elective repeat C/Section Hx pp depression; started on zoloft PUPPs; will send rx for hydrocortisone cream Doing well and stable for discharge home RX's: Ibuprofen 600mg  po Q 6 hrs prn pain #30 Refill x 1 Percocet 5/325 1 - 2 tabs po every 6 hrs prn pain  #30 No refill Colace 100mg  po up to TID prn #30 Ref x 1 Rt pp visit in 6 weeks    Signed: Demetrius Revel, MSN, [facility] 04/07/2013, 9:48 AM

## 2013-04-07 NOTE — Lactation Note (Signed)
This note was copied from the chart of [name] Huang. Lactation Consultation Note  Patient Name: [name] Unity Luepke ZOXWR'U Date: 04/07/2013 Reason for consult: Follow-up assessment Mom requested assist with latching baby. Mom reports some nipple tenderness, no breakdown noted. Baby at 10% weight loss, having lots of voids/stools. Mom feels her breasts are beginning to fill. Soft at this visit. Care for sore nipples reviewed, comfort gels given with instructions. Assisted Mom with positioning and obtaining more depth with latch. Baby demonstrated a good rhythmic suck, some swallows noted. Advised Mom baby should be at the breast at least 8-12 times in 24 hours for 15-20 min or more. Engorgement care reviewed if needed. Advised of OP services and support group.   Maternal Data    Feeding Feeding Type: Breast Fed Length of feed: 9 min  LATCH Score/Interventions Latch: Grasps breast easily, tongue down, lips flanged, rhythmical sucking. Intervention(s): Adjust position;Assist with latch;Breast massage;Breast compression  Audible Swallowing: A few with stimulation  Type of Nipple: Everted at rest and after stimulation  Comfort (Breast/Nipple): Filling, red/small blisters or bruises, mild/mod discomfort  Problem noted: Mild/Moderate discomfort Interventions (Mild/moderate discomfort): Comfort gels;Hand massage;Hand expression (EBM to sore nipples)  Hold (Positioning): Assistance needed to correctly position infant at breast and maintain latch. Intervention(s): Breastfeeding basics reviewed;Support Pillows;Position options;Skin to skin  LATCH Score: 7  Lactation Tools Discussed/Used Tools: Comfort gels   Consult Status Consult Status: Complete Date: 04/07/13 Follow-up type: In-patient    Alfred Levins 04/07/2013, 10:04 AM

## 2013-04-09 NOTE — Discharge Summary (Signed)
Obstetric Discharge Summary Reason for Admission: G4 P1 0 2 1 @ 39wk 6d for elective repeat C/S Prenatal Procedures: NST and ultrasound Intrapartum Procedures: cesarean: low cervical, transverse Postpartum Procedures: none Complications-Operative and Postpartum: none Hemoglobin  Date Value Range Status  04/05/2013 10.3* 12.0 - 15.0 g/dL Final     HCT  Date Value Range Status  04/05/2013 30.1* 36.0 - 46.0 % Final    Physical Exam:  General: alert, cooperative and no distress Lochia: appropriate Uterine Fundus: firm Incision: well approximated with subcuticular closure.  Tegaderm and honeycomb removed.  Mod dependent edema; will recheck incision in the office next week. DVT Evaluation: No evidence of DVT seen on physical exam. Negative Homan's sign.  Discharge Diagnoses: G4 P2 0 2 2 s/p repeat c/s       Hx pp depression; zoloft 50mg  po daily has been started. Discharge Information: Date: 04/09/2013 Activity: pelvic rest Diet: routine Medications: PNV, Ibuprofen, Colace and Dilaudid Condition: stable Instructions: refer to practice specific booklet Discharge to: home Follow-up Information   Follow up with Lenoard Aden, MD In 6 weeks. (Incision check in the office 04/11/13)    Specialty:  Obstetrics and Gynecology   Contact information:   [address] [place] [address] [phone]       Newborn Data: Live born female on [birth year]/13  Birth Weight: 8 lb 10.5 oz (3925 g) APGAR: 9, 10  Home with mother.  Waylon Hershey K 04/09/2013, 1:05 PM

## 2013-06-05 ENCOUNTER — Telehealth: Payer: Self-pay | Admitting: Family Medicine

## 2014-03-05 ENCOUNTER — Encounter (HOSPITAL_COMMUNITY): Payer: Self-pay | Admitting: Obstetrics and Gynecology

## 2014-11-29 LAB — OB RESULTS CONSOLE HEPATITIS B SURFACE ANTIGEN: Hepatitis B Surface Ag: NEGATIVE

## 2014-11-29 LAB — OB RESULTS CONSOLE GC/CHLAMYDIA
CHLAMYDIA, DNA PROBE: NEGATIVE
Gonorrhea: NEGATIVE

## 2014-11-29 LAB — OB RESULTS CONSOLE ANTIBODY SCREEN: Antibody Screen: NEGATIVE

## 2014-11-29 LAB — OB RESULTS CONSOLE RPR: RPR: NONREACTIVE

## 2014-11-29 LAB — OB RESULTS CONSOLE HIV ANTIBODY (ROUTINE TESTING): HIV: NONREACTIVE

## 2014-11-29 LAB — OB RESULTS CONSOLE RUBELLA ANTIBODY, IGM: Rubella: IMMUNE

## 2014-11-29 LAB — OB RESULTS CONSOLE ABO/RH: RH Type: POSITIVE

## 2014-12-07 ENCOUNTER — Encounter: Payer: Self-pay | Admitting: Gastroenterology

## 2015-05-25 ENCOUNTER — Inpatient Hospital Stay (HOSPITAL_COMMUNITY)
Admission: AD | Admit: 2015-05-25 | Discharge: 2015-05-25 | Disposition: A | Discharge status: Home / Self Care | Payer: BLUE CROSS/BLUE SHIELD | Source: Ambulatory Visit | Attending: Obstetrics and Gynecology | Admitting: Obstetrics and Gynecology

## 2015-05-25 ENCOUNTER — Encounter (HOSPITAL_COMMUNITY): Payer: Self-pay | Admitting: *Deleted

## 2015-05-25 DIAGNOSIS — Z87891 Personal history of nicotine dependence: Secondary | ICD-10-CM | POA: Insufficient documentation

## 2015-05-25 DIAGNOSIS — K589 Irritable bowel syndrome without diarrhea: Secondary | ICD-10-CM | POA: Diagnosis not present

## 2015-05-25 DIAGNOSIS — Z87442 Personal history of urinary calculi: Secondary | ICD-10-CM | POA: Diagnosis not present

## 2015-05-25 DIAGNOSIS — O4703 False labor before 37 completed weeks of gestation, third trimester: Secondary | ICD-10-CM

## 2015-05-25 DIAGNOSIS — Z3A35 35 weeks gestation of pregnancy: Secondary | ICD-10-CM | POA: Insufficient documentation

## 2015-05-25 DIAGNOSIS — J45909 Unspecified asthma, uncomplicated: Secondary | ICD-10-CM | POA: Insufficient documentation

## 2015-05-25 DIAGNOSIS — M549 Dorsalgia, unspecified: Secondary | ICD-10-CM | POA: Insufficient documentation

## 2015-05-25 DIAGNOSIS — O26893 Other specified pregnancy related conditions, third trimester: Secondary | ICD-10-CM | POA: Insufficient documentation

## 2015-05-25 DIAGNOSIS — R102 Pelvic and perineal pain: Secondary | ICD-10-CM | POA: Diagnosis not present

## 2015-05-25 LAB — URINALYSIS, ROUTINE W REFLEX MICROSCOPIC
BILIRUBIN URINE: NEGATIVE
Glucose, UA: NEGATIVE mg/dL
Hgb urine dipstick: NEGATIVE
KETONES UR: 15 mg/dL — AB
LEUKOCYTES UA: NEGATIVE
NITRITE: NEGATIVE
Protein, ur: 30 mg/dL — AB
SPECIFIC GRAVITY, URINE: 1.025 (ref 1.005–1.030)
pH: 6.5 (ref 5.0–8.0)

## 2015-05-25 LAB — URINE MICROSCOPIC-ADD ON

## 2015-05-25 NOTE — MAU Note (Signed)
Having abd tightening and pelvic pain and pressure for 24hrs. Occ ctxs. For repeat C/S on 2/17. Denies LOF or bleeding

## 2015-05-25 NOTE — MAU Provider Note (Signed)
History     CSN: [id number]  Arrival date and time: 05/25/15 1932  Provider on unit: 1932 Provider at bedside: 2010     CC: Preterm contractions  HPI  Ms. [name] Huang is a 40 yo [id number] female at 35.[redacted]wks gestation by ultrasound, presenting with c/o contractions all night and day.  She describes the pain as "cramping pelvic and low back pain". She reports having anywhere from 4-20 contractions on a daily basis, but "these are different".  She tried resting, hydration, Tylenol, and a warm bath to get the contractions to slow down, but they have not subsided. Denies VB, vaginal d/c, or LOF.  Her prenatal care is significant for: previous C/S x 2. Her primary OB provider at [facility] is Dr. Ronita Hipps.  Past Medical History  Diagnosis Date  . Asthma   . Abnormal colonoscopy     precancerous cells removed  . Mild hyperemesis gravidarum, antepartum   . IBS (irritable bowel syndrome)   . 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 PP care - 1C/S 5/21 09/23/2011  . Poison ivy   . Carpal tunnel syndrome   . Kidney stone     Past Surgical History  Procedure Laterality Date  . Cesarean section  09/22/2011    Procedure: CESAREAN SECTION;  Surgeon: Lovenia Kim, MD;  Location: [facility];  Service: Gynecology;  Laterality: N/A;  . Wisdom tooth extraction    . Cesarean section N/A 04/04/2013    Procedure: Repeat CESAREAN SECTION;  Surgeon: Lovenia Kim, MD;  Location: [facility];  Service: Obstetrics;  Laterality: N/A;  EDD: 04/05/13    Family History  Problem Relation Age of Onset  . Anesthesia problems Neg Hx   . Hypotension Neg Hx   . Malignant hyperthermia Neg Hx   . Pseudochol deficiency Neg Hx   . Hypertension Mother   . Diabetes Mother   . Peripheral vascular disease Mother   . Hypertension Father   . Diabetes Father   . Mental illness Father     bipolar  . Heart attack Paternal Grandfather   . Heart disease Paternal Grandfather     Social History   Substance Use Topics  . Smoking status: Former Smoker    Quit date: 11/19/2004  . Smokeless tobacco: Never Used  . Alcohol Use: 0.0 oz/week    1-2 Glasses of wine per week    Allergies:  Allergies  Allergen Reactions  . Amoxicillin Hives  . Codeine Hives    Prescriptions prior to admission  Medication Sig Dispense Refill Last Dose  . acetaminophen (TYLENOL) 500 MG tablet Take 500 mg by mouth every 6 (six) hours as needed. For pain   Past Month at Unknown time  . albuterol (PROVENTIL HFA;VENTOLIN HFA) 108 (90 BASE) MCG/ACT inhaler Inhale 2 puffs into the lungs every 6 (six) hours as needed.   Past Month at Unknown time  . calcium carbonate (TUMS - DOSED IN MG ELEMENTAL CALCIUM) 500 MG chewable tablet Chew 1 tablet by mouth as needed for indigestion or heartburn.   04/03/2013 at Unknown time  . cetirizine (ZYRTEC) 10 MG tablet Take 10 mg by mouth daily.   Unknown at Unknown time  . HYDROmorphone (DILAUDID) 2 MG tablet Take 1-2 tablets (2-4 mg total) by mouth every 4 (four) hours as needed for severe pain. 30 tablet 0   . ibuprofen (ADVIL,MOTRIN) 600 MG tablet Take 1 tablet (600 mg total) by mouth every 6 (six) hours as needed. 30 tablet  0   . Prenatal Vit-Fe Fumarate-FA (PRENATAL MULTIVITAMIN) TABS Take 1 tablet by mouth every morning.    04/03/2013 at Unknown time  . ranitidine (ZANTAC) 75 MG tablet Take 75 mg by mouth 2 (two) times daily.   04/04/2013 at Unknown time  . senna-docusate (SENOKOT-S) 8.6-50 MG per tablet Take 2 tablets by mouth at bedtime as needed for mild constipation. 30 tablet 1   . sertraline (ZOLOFT) 50 MG tablet Take 1 tablet (50 mg total) by mouth daily. 30 tablet 4     Review of Systems  Constitutional: Negative.   HENT: Negative.   Eyes: Negative.   Respiratory: Negative.   Cardiovascular: Negative.   Gastrointestinal: Negative.   Genitourinary:       Irregular UC's all night and day  Musculoskeletal: Negative.   Skin: Negative.   Neurological: Negative.    Endo/Heme/Allergies: Negative.   Psychiatric/Behavioral: Negative.    Results for orders placed or performed during the hospital encounter of 05/25/15 (from the past 24 hour(s))  Urinalysis, Routine w reflex microscopic (not at [facility])     Status: Abnormal   Collection Time: 05/25/15  7:45 PM  Result Value Ref Range   Color, Urine YELLOW YELLOW   APPearance CLEAR CLEAR   Specific Gravity, Urine 1.025 1.005 - 1.030   pH 6.5 5.0 - 8.0   Glucose, UA NEGATIVE NEGATIVE mg/dL   Hgb urine dipstick NEGATIVE NEGATIVE   Bilirubin Urine NEGATIVE NEGATIVE   Ketones, ur 15 (A) NEGATIVE mg/dL   Protein, ur 30 (A) NEGATIVE mg/dL   Nitrite NEGATIVE NEGATIVE   Leukocytes, UA NEGATIVE NEGATIVE  Urine microscopic-add on     Status: Abnormal   Collection Time: 05/25/15  7:45 PM  Result Value Ref Range   Squamous Epithelial / LPF 0-5 (A) NONE SEEN   WBC, UA 0-5 0 - 5 WBC/hpf   RBC / HPF 0-5 0 - 5 RBC/hpf   Bacteria, UA FEW (A) NONE SEEN   Urine-Other MUCOUS PRESENT    CEFM  FHR: 150 bpm / moderate variability / accels present / decels absent TOCO: irregular, 3 contractions for 50 mins of tracing  Physical Exam   Blood pressure 129/81, pulse 96, temperature 97.7 F (36.5 C), resp. rate 18, height 5\' 2"  (1.575 m), weight 94.983 kg (209 lb 6.4 oz), SpO2 99 %.  Physical Exam  Constitutional: She is oriented to person, place, and time. She appears well-developed and well-nourished.  HENT:  Head: Normocephalic.  Neck: Normal range of motion.  Cardiovascular: Normal rate, regular rhythm, normal heart sounds and intact distal pulses.   Respiratory: Effort normal and breath sounds normal.  GI: Soft. Bowel sounds are normal.  Genitourinary:  Gravid, soft, non-tender; VE: closed/long/vtx/highsoft/posterior  Musculoskeletal: Normal range of motion.  Neurological: She is alert and oriented to person, place, and time. She has normal reflexes.  Skin: Skin is warm and dry.  Psychiatric: She has a normal  mood and affect. Her behavior is normal. Judgment and thought content normal.    MAU Course  Procedures CCUA CEFM UCx  Assessment and Plan  40 yo [id number] at 35.[redacted] wks gestation Preterm Contractions Pelvic Pain Back Pain Category I FHR tracing  Discharge home Preterm labor precautions reviewed Call the office, if sx's worsen Keep scheduled appointment with Dr. Ronita Hipps on Tuesday 05/28/2015 RN will call if needs to be started on abx for UTI - UCx results should be complete by Burley Saver, M MSN, CNM 05/25/2015, 8:14 PM

## 2015-05-25 NOTE — Discharge Instructions (Signed)
Abdominal Pain During Pregnancy °Abdominal pain is common in pregnancy. Most of the time, it does not cause harm. There are many causes of abdominal pain. Some causes are more serious than others. Some of the causes of abdominal pain in pregnancy are easily diagnosed. Occasionally, the diagnosis takes time to understand. Other times, the cause is not determined. Abdominal pain can be a sign that something is very wrong with the pregnancy, or the pain may have nothing to do with the pregnancy at all. For this reason, always tell your health care provider if you have any abdominal discomfort. °HOME CARE INSTRUCTIONS  °Monitor your abdominal pain for any changes. The following actions may help to alleviate any discomfort you are experiencing: °· Do not have sexual intercourse or put anything in your vagina until your symptoms go away completely. °· Get plenty of rest until your pain improves. °· Drink clear fluids if you feel nauseous. Avoid solid food as long as you are uncomfortable or nauseous. °· Only take over-the-counter or prescription medicine as directed by your health care provider. °· Keep all follow-up appointments with your health care provider. °SEEK IMMEDIATE MEDICAL CARE IF: °· You are bleeding, leaking fluid, or passing tissue from the vagina. °· You have increasing pain or cramping. °· You have persistent vomiting. °· You have painful or bloody urination. °· You have a fever. °· You notice a decrease in your baby's movements. °· You have extreme weakness or feel faint. °· You have shortness of breath, with or without abdominal pain. °· You develop a severe headache with abdominal pain. °· You have abnormal vaginal discharge with abdominal pain. °· You have persistent diarrhea. °· You have abdominal pain that continues even after rest, or gets worse. °MAKE SURE YOU:  °· Understand these instructions. °· Will watch your condition. °· Will get help right away if you are not doing well or get worse. °    °This information is not intended to replace advice given to you by your health care provider. Make sure you discuss any questions you have with your health care provider. °  °Document Released: 04/20/2005 Document Revised: 02/08/2013 Document Reviewed: 11/17/2012 °Elsevier Interactive Patient Education ©2016 Elsevier Inc[employer] °Preterm Labor Information °Preterm labor is when labor starts at less than 37 weeks of pregnancy. The normal length of a pregnancy is 39 to 41 weeks. °CAUSES °Often, there is no identifiable underlying cause as to why a woman goes into preterm labor. One of the most common known causes of preterm labor is infection. Infections of the uterus, cervix, vagina, amniotic sac, bladder, kidney, or even the lungs (pneumonia) can cause labor to start. Other suspected causes of preterm labor include:  °· Urogenital infections, such as yeast infections and bacterial vaginosis.   °· Uterine abnormalities (uterine shape, uterine septum, fibroids, or bleeding from the placenta).   °· A cervix that has been operated on (it may fail to stay closed).   °· Malformations in the fetus.   °· Multiple gestations (twins, triplets, and so on).   °· Breakage of the amniotic sac.   °RISK FACTORS °· Having a previous history of preterm labor.   °· Having premature rupture of membranes (PROM).   °· Having a placenta that covers the opening of the cervix (placenta previa).   °· Having a placenta that separates from the uterus (placental abruption).   °· Having a cervix that is too weak to hold the fetus in the uterus (incompetent cervix).   °· Having too much fluid in the amniotic sac (polyhydramnios).   °· Taking   illegal drugs or smoking while pregnant.   °· Not gaining enough weight while pregnant.   °· Being younger than 18 and older than 38 years old.   °· Having a low socioeconomic status.   °· Being African American. °SYMPTOMS °Signs and symptoms of preterm labor include:  °· Menstrual-like cramps, abdominal pain, or  back pain. °· Uterine contractions that are regular, as frequent as six in an hour, regardless of their intensity (may be mild or painful). °· Contractions that start on the top of the uterus and spread down to the lower abdomen and back.   °· A sense of increased pelvic pressure.   °· A watery or bloody mucus discharge that comes from the vagina.   °TREATMENT °Depending on the length of the pregnancy and other circumstances, your health care provider may suggest bed rest. If necessary, there are medicines that can be given to stop contractions and to mature the fetal lungs. If labor happens before 34 weeks of pregnancy, a prolonged hospital stay may be recommended. Treatment depends on the condition of both you and the fetus.  °WHAT SHOULD YOU DO IF YOU THINK YOU ARE IN PRETERM LABOR? °Call your health care provider right away. You will need to go to the hospital to get checked immediately. °HOW CAN YOU PREVENT PRETERM LABOR IN FUTURE PREGNANCIES? °You should:  °· Stop smoking if you smoke.  °· Maintain healthy weight gain and avoid chemicals and drugs that are not necessary. °· Be watchful for any type of infection. °· Inform your health care provider if you have a known history of preterm labor. °  °This information is not intended to replace advice given to you by your health care provider. Make sure you discuss any questions you have with your health care provider. °  °Document Released: 07/11/2003 Document Revised: 12/21/2012 Document Reviewed: 05/23/2012 °Elsevier Interactive Patient Education ©2016 Elsevier Inc. ° °

## 2015-05-28 ENCOUNTER — Other Ambulatory Visit: Payer: Self-pay | Admitting: Obstetrics and Gynecology

## 2015-06-19 ENCOUNTER — Encounter (HOSPITAL_COMMUNITY)
Admission: RE | Admit: 2015-06-19 | Discharge: 2015-06-19 | Disposition: A | Discharge status: Home / Self Care | Payer: BLUE CROSS/BLUE SHIELD | Source: Ambulatory Visit | Attending: Obstetrics and Gynecology | Admitting: Obstetrics and Gynecology

## 2015-06-19 ENCOUNTER — Encounter (HOSPITAL_COMMUNITY): Payer: Self-pay

## 2015-06-19 HISTORY — DX: Gastro-esophageal reflux disease without esophagitis: K21.9

## 2015-06-19 HISTORY — DX: Other specified postprocedural states: Z98.890

## 2015-06-19 HISTORY — DX: Nausea with vomiting, unspecified: R11.2

## 2015-06-19 LAB — CBC
HCT: 37.3 % (ref 36.0–46.0)
Hemoglobin: 12.5 g/dL (ref 12.0–15.0)
MCH: 28.6 pg (ref 26.0–34.0)
MCHC: 33.5 g/dL (ref 30.0–36.0)
MCV: 85.4 fL (ref 78.0–100.0)
PLATELETS: 251 10*3/uL (ref 150–400)
RBC: 4.37 MIL/uL (ref 3.87–5.11)
RDW: 13.6 % (ref 11.5–15.5)
WBC: 10.9 10*3/uL — ABNORMAL HIGH (ref 4.0–10.5)

## 2015-06-19 NOTE — Patient Instructions (Signed)
Your procedure is scheduled on:  Friday, Feb. 17, 2017  Enter through the [employer] [facility] at: 8:00 A.M.  Pick up the phone at the desk and dial [phone].  Call this number if you have problems the morning of surgery: [phone].  Remember: Do NOT eat food or drink after:  Midnight Thursday   Take these medicines the morning of surgery with a SIP OF WATER: None  *Bring asthma inhaler day of surgery  Do NOT wear jewelry (body piercing), metal hair clips/bobby pins, or nail polish. Do NOT wear lotions, powders, or perfumes.  You may wear deoderant. Do NOT shave for 48 hours prior to surgery. Do NOT bring valuables to the hospital.  Leave suitcase in car.  After surgery it may be brought to your room.  For patients admitted to the hospital, checkout time is 11:00 AM the day of discharge.

## 2015-06-20 LAB — RPR: RPR Ser Ql: NONREACTIVE

## 2015-06-20 NOTE — Anesthesia Preprocedure Evaluation (Addendum)
Anesthesia Evaluation  Patient identified by MRN, date of birth, ID band Patient awake    Reviewed: Allergy & Precautions, NPO status , Patient's Chart, lab work & pertinent test results  History of Anesthesia Complications (+) PONV and history of anesthetic complications  Airway Mallampati: II  TM Distance: >3 FB Neck ROM: Full    Dental no notable dental hx. (+) Dental Advisory Given   Pulmonary asthma , former smoker,    Pulmonary exam normal breath sounds clear to auscultation       Cardiovascular negative cardio ROS Normal cardiovascular exam Rhythm:Regular Rate:Normal     Neuro/Psych negative neurological ROS  negative psych ROS   GI/Hepatic Neg liver ROS, GERD  Medicated and Controlled,  Endo/Other  Morbid obesity  Renal/GU negative Renal ROS  negative genitourinary   Musculoskeletal negative musculoskeletal ROS (+)   Abdominal   Peds negative pediatric ROS (+)  Hematology negative hematology ROS (+)   Anesthesia Other Findings   Reproductive/Obstetrics (+) Pregnancy                            Anesthesia Physical Anesthesia Plan  ASA: III  Anesthesia Plan: Spinal   Post-op Pain Management:    Induction:   Airway Management Planned:   Additional Equipment:   Intra-op Plan:   Post-operative Plan:   Informed Consent: I have reviewed the patients History and Physical, chart, labs and discussed the procedure including the risks, benefits and alternatives for the proposed anesthesia with the patient or authorized representative who has indicated his/her understanding and acceptance.   Dental advisory given  Plan Discussed with: CRNA  Anesthesia Plan Comments:         Anesthesia Quick Evaluation

## 2015-06-21 ENCOUNTER — Encounter (HOSPITAL_COMMUNITY): Payer: Self-pay

## 2015-06-21 ENCOUNTER — Encounter (HOSPITAL_COMMUNITY)
Admission: AD | Disposition: A | Discharge status: Home / Self Care | Payer: Self-pay | Source: Ambulatory Visit | Attending: Obstetrics and Gynecology

## 2015-06-21 ENCOUNTER — Inpatient Hospital Stay (HOSPITAL_COMMUNITY)
Admission: AD | Admit: 2015-06-21 | Discharge: 2015-06-24 | DRG: 765 | Disposition: A | Discharge status: Home / Self Care | Payer: BLUE CROSS/BLUE SHIELD | Source: Ambulatory Visit | Attending: Obstetrics | Admitting: Obstetrics

## 2015-06-21 ENCOUNTER — Inpatient Hospital Stay (HOSPITAL_COMMUNITY): Payer: BLUE CROSS/BLUE SHIELD | Admitting: Anesthesiology

## 2015-06-21 DIAGNOSIS — K219 Gastro-esophageal reflux disease without esophagitis: Secondary | ICD-10-CM | POA: Diagnosis present

## 2015-06-21 DIAGNOSIS — Z87442 Personal history of urinary calculi: Secondary | ICD-10-CM | POA: Diagnosis not present

## 2015-06-21 DIAGNOSIS — O9962 Diseases of the digestive system complicating childbirth: Secondary | ICD-10-CM | POA: Diagnosis present

## 2015-06-21 DIAGNOSIS — O99214 Obesity complicating childbirth: Secondary | ICD-10-CM | POA: Diagnosis present

## 2015-06-21 DIAGNOSIS — Z87891 Personal history of nicotine dependence: Secondary | ICD-10-CM

## 2015-06-21 DIAGNOSIS — Z6841 Body Mass Index (BMI) 40.0 and over, adult: Secondary | ICD-10-CM

## 2015-06-21 DIAGNOSIS — Z3A39 39 weeks gestation of pregnancy: Secondary | ICD-10-CM

## 2015-06-21 DIAGNOSIS — Z98891 History of uterine scar from previous surgery: Secondary | ICD-10-CM

## 2015-06-21 DIAGNOSIS — Z833 Family history of diabetes mellitus: Secondary | ICD-10-CM

## 2015-06-21 DIAGNOSIS — Z8249 Family history of ischemic heart disease and other diseases of the circulatory system: Secondary | ICD-10-CM

## 2015-06-21 DIAGNOSIS — O34211 Maternal care for low transverse scar from previous cesarean delivery: Principal | ICD-10-CM | POA: Diagnosis present

## 2015-06-21 LAB — PREPARE RBC (CROSSMATCH)

## 2015-06-21 SURGERY — Surgical Case
Anesthesia: Spinal

## 2015-06-21 MED ORDER — BUPIVACAINE IN DEXTROSE 0.75-8.25 % IT SOLN: INTRATHECAL | Status: DC | PRN

## 2015-06-21 MED ORDER — KETOROLAC TROMETHAMINE 30 MG/ML IJ SOLN
INTRAMUSCULAR | Status: AC
  Administered 2015-06-21: 30 mg
  Filled 2015-06-21: qty 1

## 2015-06-21 MED ORDER — HYDROCORTISONE 1 % EX CREA
TOPICAL_CREAM | Freq: Three times a day (TID) | CUTANEOUS | Status: DC
  Administered 2015-06-23 (×2): via TOPICAL
  Filled 2015-06-21 (×2): qty 28

## 2015-06-21 MED ORDER — SCOPOLAMINE 1 MG/3DAYS TD PT72
1.0000 | MEDICATED_PATCH | Freq: Once | TRANSDERMAL | Status: DC
  Administered 2015-06-21: 1.5 mg via TRANSDERMAL

## 2015-06-21 MED ORDER — DIPHENHYDRAMINE HCL 25 MG PO CAPS
25.0000 mg | ORAL_CAPSULE | ORAL | Status: DC | PRN
  Administered 2015-06-21 – 2015-06-24 (×4): 25 mg via ORAL
  Filled 2015-06-21 (×5): qty 1

## 2015-06-21 MED ORDER — OXYTOCIN 10 UNIT/ML IJ SOLN
40.0000 [IU] | INTRAVENOUS | Status: DC | PRN
  Administered 2015-06-21: 40 [IU] via INTRAVENOUS

## 2015-06-21 MED ORDER — ONDANSETRON HCL 4 MG/2ML IJ SOLN
INTRAMUSCULAR | Status: DC | PRN
  Administered 2015-06-21: 4 mg via INTRAVENOUS

## 2015-06-21 MED ORDER — CEFAZOLIN SODIUM-DEXTROSE 2-3 GM-% IV SOLR
INTRAVENOUS | Status: AC
  Filled 2015-06-21: qty 50

## 2015-06-21 MED ORDER — ONDANSETRON HCL 4 MG/2ML IJ SOLN: 4.0000 mg | Freq: Three times a day (TID) | INTRAMUSCULAR | Status: DC | PRN

## 2015-06-21 MED ORDER — SCOPOLAMINE 1 MG/3DAYS TD PT72
1.0000 | MEDICATED_PATCH | Freq: Once | TRANSDERMAL | Status: DC
  Filled 2015-06-21: qty 1

## 2015-06-21 MED ORDER — METHYLERGONOVINE MALEATE 0.2 MG PO TABS: 0.2000 mg | ORAL_TABLET | ORAL | Status: DC | PRN

## 2015-06-21 MED ORDER — SERTRALINE HCL 50 MG PO TABS
50.0000 mg | ORAL_TABLET | Freq: Every day | ORAL | Status: DC
  Administered 2015-06-21 – 2015-06-24 (×4): 50 mg via ORAL
  Filled 2015-06-21 (×3): qty 1

## 2015-06-21 MED ORDER — ACETAMINOPHEN 500 MG PO TABS
1000.0000 mg | ORAL_TABLET | Freq: Four times a day (QID) | ORAL | Status: AC
  Administered 2015-06-21: 1000 mg via ORAL
  Filled 2015-06-21: qty 2

## 2015-06-21 MED ORDER — KETOROLAC TROMETHAMINE 30 MG/ML IJ SOLN: 30.0000 mg | Freq: Four times a day (QID) | INTRAMUSCULAR | Status: AC | PRN

## 2015-06-21 MED ORDER — ZOLPIDEM TARTRATE 5 MG PO TABS: 5.0000 mg | ORAL_TABLET | Freq: Every evening | ORAL | Status: DC | PRN

## 2015-06-21 MED ORDER — LACTATED RINGERS IV SOLN
Freq: Once | INTRAVENOUS | Status: AC
  Administered 2015-06-21: 09:00:00 via INTRAVENOUS

## 2015-06-21 MED ORDER — SIMETHICONE 80 MG PO CHEW
80.0000 mg | CHEWABLE_TABLET | ORAL | Status: DC
  Administered 2015-06-22 – 2015-06-23 (×3): 80 mg via ORAL
  Filled 2015-06-21 (×3): qty 1

## 2015-06-21 MED ORDER — TETANUS-DIPHTH-ACELL PERTUSSIS 5-2.5-18.5 LF-MCG/0.5 IM SUSP: 0.5000 mL | Freq: Once | INTRAMUSCULAR | Status: DC

## 2015-06-21 MED ORDER — SODIUM CHLORIDE 0.9% FLUSH: 3.0000 mL | INTRAVENOUS | Status: DC | PRN

## 2015-06-21 MED ORDER — MEPERIDINE HCL 25 MG/ML IJ SOLN: 6.2500 mg | INTRAMUSCULAR | Status: DC | PRN

## 2015-06-21 MED ORDER — LACTATED RINGERS IV SOLN: INTRAVENOUS | Status: DC

## 2015-06-21 MED ORDER — INFLUENZA VAC SPLIT QUAD 0.5 ML IM SUSY
0.5000 mL | PREFILLED_SYRINGE | INTRAMUSCULAR | Status: AC
  Administered 2015-06-23: 0.5 mL via INTRAMUSCULAR
  Filled 2015-06-21: qty 0.5

## 2015-06-21 MED ORDER — METHYLERGONOVINE MALEATE 0.2 MG/ML IJ SOLN: 0.2000 mg | INTRAMUSCULAR | Status: DC | PRN

## 2015-06-21 MED ORDER — DIBUCAINE 1 % RE OINT: 1.0000 "application " | TOPICAL_OINTMENT | RECTAL | Status: DC | PRN

## 2015-06-21 MED ORDER — ACETAMINOPHEN 325 MG PO TABS: 650.0000 mg | ORAL_TABLET | ORAL | Status: DC | PRN

## 2015-06-21 MED ORDER — CEFAZOLIN SODIUM-DEXTROSE 2-3 GM-% IV SOLR
2.0000 g | INTRAVENOUS | Status: AC
  Administered 2015-06-21: 2 g via INTRAVENOUS

## 2015-06-21 MED ORDER — MORPHINE SULFATE (PF) 0.5 MG/ML IJ SOLN
INTRAMUSCULAR | Status: DC | PRN
  Administered 2015-06-21: .2 mg via EPIDURAL

## 2015-06-21 MED ORDER — FENTANYL CITRATE (PF) 100 MCG/2ML IJ SOLN
INTRAMUSCULAR | Status: DC | PRN
  Administered 2015-06-21: 10 ug via INTRAVENOUS

## 2015-06-21 MED ORDER — LACTATED RINGERS IV SOLN
INTRAVENOUS | Status: DC | PRN
  Administered 2015-06-21: 10:00:00 via INTRAVENOUS

## 2015-06-21 MED ORDER — SIMETHICONE 80 MG PO CHEW
80.0000 mg | CHEWABLE_TABLET | ORAL | Status: DC | PRN
  Administered 2015-06-21 – 2015-06-22 (×3): 80 mg via ORAL
  Filled 2015-06-21 (×3): qty 1

## 2015-06-21 MED ORDER — WITCH HAZEL-GLYCERIN EX PADS: 1.0000 "application " | MEDICATED_PAD | CUTANEOUS | Status: DC | PRN

## 2015-06-21 MED ORDER — DIPHENHYDRAMINE HCL 25 MG PO CAPS
25.0000 mg | ORAL_CAPSULE | Freq: Four times a day (QID) | ORAL | Status: DC | PRN
  Administered 2015-06-21: 25 mg via ORAL

## 2015-06-21 MED ORDER — ONDANSETRON HCL 4 MG/2ML IJ SOLN: 4.0000 mg | Freq: Once | INTRAMUSCULAR | Status: DC | PRN

## 2015-06-21 MED ORDER — DIPHENHYDRAMINE HCL 50 MG/ML IJ SOLN
12.5000 mg | INTRAMUSCULAR | Status: DC | PRN
  Administered 2015-06-21: 12.5 mg via INTRAVENOUS
  Filled 2015-06-21: qty 1

## 2015-06-21 MED ORDER — BUPIVACAINE IN DEXTROSE 0.75-8.25 % IT SOLN
INTRATHECAL | Status: DC | PRN
  Administered 2015-06-21: 1.6 mL via INTRATHECAL

## 2015-06-21 MED ORDER — BUPIVACAINE HCL (PF) 0.25 % IJ SOLN
INTRAMUSCULAR | Status: AC
  Filled 2015-06-21: qty 10

## 2015-06-21 MED ORDER — IBUPROFEN 600 MG PO TABS
600.0000 mg | ORAL_TABLET | Freq: Four times a day (QID) | ORAL | Status: DC
Start: 2015-06-21 — End: 2015-06-24
  Administered 2015-06-21 – 2015-06-24 (×12): 600 mg via ORAL
  Filled 2015-06-21 (×12): qty 1

## 2015-06-21 MED ORDER — FENTANYL CITRATE (PF) 100 MCG/2ML IJ SOLN: 25.0000 ug | INTRAMUSCULAR | Status: DC | PRN

## 2015-06-21 MED ORDER — PHENYLEPHRINE 8 MG IN D5W 100 ML (0.08MG/ML) PREMIX OPTIME
INJECTION | INTRAVENOUS | Status: DC | PRN
  Administered 2015-06-21: 60 ug/min via INTRAVENOUS

## 2015-06-21 MED ORDER — BUPIVACAINE LIPOSOME 1.3 % IJ SUSP: 20.0000 mL | Freq: Once | INTRAMUSCULAR | Status: DC

## 2015-06-21 MED ORDER — PRENATAL MULTIVITAMIN CH
1.0000 | ORAL_TABLET | Freq: Every day | ORAL | Status: DC
  Administered 2015-06-22 – 2015-06-24 (×3): 1 via ORAL
  Filled 2015-06-21 (×3): qty 1

## 2015-06-21 MED ORDER — HYDROMORPHONE HCL 2 MG PO TABS
4.0000 mg | ORAL_TABLET | ORAL | Status: DC | PRN
  Administered 2015-06-21 – 2015-06-23 (×10): 4 mg via ORAL
  Administered 2015-06-23: 2 mg via ORAL
  Administered 2015-06-23 – 2015-06-24 (×4): 4 mg via ORAL
  Filled 2015-06-21 (×15): qty 2

## 2015-06-21 MED ORDER — SODIUM CHLORIDE 0.9 % IR SOLN
Status: DC | PRN
Start: 2015-06-21 — End: 2015-06-21
  Administered 2015-06-21: 1000 mL

## 2015-06-21 MED ORDER — BUPIVACAINE HCL (PF) 0.25 % IJ SOLN
INTRAMUSCULAR | Status: DC | PRN
  Administered 2015-06-21: 20 mL

## 2015-06-21 MED ORDER — NALOXONE HCL 2 MG/2ML IJ SOSY: 1.0000 ug/kg/h | PREFILLED_SYRINGE | INTRAVENOUS | Status: DC | PRN

## 2015-06-21 MED ORDER — OXYTOCIN 10 UNIT/ML IJ SOLN: 2.5000 [IU]/h | INTRAVENOUS | Status: AC

## 2015-06-21 MED ORDER — SENNOSIDES-DOCUSATE SODIUM 8.6-50 MG PO TABS
2.0000 | ORAL_TABLET | ORAL | Status: DC
  Administered 2015-06-22 – 2015-06-23 (×3): 2 via ORAL
  Filled 2015-06-21 (×3): qty 2

## 2015-06-21 MED ORDER — SIMETHICONE 80 MG PO CHEW
80.0000 mg | CHEWABLE_TABLET | Freq: Three times a day (TID) | ORAL | Status: DC
  Administered 2015-06-22 – 2015-06-24 (×7): 80 mg via ORAL
  Filled 2015-06-21 (×8): qty 1

## 2015-06-21 MED ORDER — NALOXONE HCL 0.4 MG/ML IJ SOLN: 0.4000 mg | INTRAMUSCULAR | Status: DC | PRN

## 2015-06-21 MED ORDER — LACTATED RINGERS IV SOLN
INTRAVENOUS | Status: DC
Start: 2015-06-21 — End: 2015-06-21
  Administered 2015-06-21 (×2): via INTRAVENOUS

## 2015-06-21 MED ORDER — LANOLIN HYDROUS EX OINT: 1.0000 "application " | TOPICAL_OINTMENT | CUTANEOUS | Status: DC | PRN

## 2015-06-21 MED ORDER — MENTHOL 3 MG MT LOZG: 1.0000 | LOZENGE | OROMUCOSAL | Status: DC | PRN

## 2015-06-21 SURGICAL SUPPLY — 40 items
CLAMP CORD UMBIL (MISCELLANEOUS) IMPLANT
CLOTH BEACON ORANGE TIMEOUT ST (SAFETY) ×2 IMPLANT
CONTAINER PREFILL 10% NBF 15ML (MISCELLANEOUS) IMPLANT
DRAPE C SECTION CLR SCREEN (DRAPES) ×1 IMPLANT
DRSG OPSITE POSTOP 4X10 (GAUZE/BANDAGES/DRESSINGS) ×2 IMPLANT
DURAPREP 26ML APPLICATOR (WOUND CARE) ×2 IMPLANT
ELECT REM PT RETURN 9FT ADLT (ELECTROSURGICAL) ×2
ELECTRODE REM PT RTRN 9FT ADLT (ELECTROSURGICAL) ×1 IMPLANT
EXTRACTOR VACUUM M CUP 4 TUBE (SUCTIONS) IMPLANT
GLOVE BIO SURGEON STRL SZ7.5 (GLOVE) ×2 IMPLANT
GLOVE BIOGEL PI IND STRL 7.0 (GLOVE) ×1 IMPLANT
GLOVE BIOGEL PI INDICATOR 7.0 (GLOVE) ×2
GLOVE ECLIPSE 7.0 STRL STRAW (GLOVE) ×1 IMPLANT
GLOVE INDICATOR 7.0 STRL GRN (GLOVE) ×1 IMPLANT
GOWN STRL REUS W/TWL LRG LVL3 (GOWN DISPOSABLE) ×4 IMPLANT
GOWN SURGICAL LARGE (GOWNS) ×1 IMPLANT
KIT ABG SYR 3ML LUER SLIP (SYRINGE) IMPLANT
NDL HYPO 25X5/8 SAFETYGLIDE (NEEDLE) IMPLANT
NDL SPNL 20GX3.5 QUINCKE YW (NEEDLE) IMPLANT
NEEDLE HYPO 22GX1.5 SAFETY (NEEDLE) ×2 IMPLANT
NEEDLE HYPO 25X5/8 SAFETYGLIDE (NEEDLE) IMPLANT
NEEDLE SPNL 20GX3.5 QUINCKE YW (NEEDLE) IMPLANT
NS IRRIG 1000ML POUR BTL (IV SOLUTION) ×2 IMPLANT
PACK C SECTION WH (CUSTOM PROCEDURE TRAY) ×2 IMPLANT
PAD ABD 7.5X8 STRL (GAUZE/BANDAGES/DRESSINGS) ×1 IMPLANT
PENCIL SMOKE EVAC W/HOLSTER (ELECTROSURGICAL) ×2 IMPLANT
SPONGE DRAIN TRACH 4X4 STRL 2S (GAUZE/BANDAGES/DRESSINGS) ×2 IMPLANT
SUT MNCRL 0 VIOLET CTX 36 (SUTURE) ×2 IMPLANT
SUT MNCRL AB 3-0 PS2 27 (SUTURE) IMPLANT
SUT MON AB 2-0 CT1 27 (SUTURE) ×2 IMPLANT
SUT MON AB-0 CT1 36 (SUTURE) ×4 IMPLANT
SUT MONOCRYL 0 CTX 36 (SUTURE) ×2
SUT PLAIN 0 NONE (SUTURE) IMPLANT
SUT PLAIN 2 0 (SUTURE)
SUT PLAIN 2 0 XLH (SUTURE) ×1 IMPLANT
SUT PLAIN ABS 2-0 CT1 27XMFL (SUTURE) IMPLANT
SYR 20CC LL (SYRINGE) IMPLANT
SYR CONTROL 10ML LL (SYRINGE) ×2 IMPLANT
TOWEL OR 17X24 6PK STRL BLUE (TOWEL DISPOSABLE) ×2 IMPLANT
TRAY FOLEY CATH SILVER 14FR (SET/KITS/TRAYS/PACK) ×2 IMPLANT

## 2015-06-21 NOTE — Anesthesia Postprocedure Evaluation (Signed)
Anesthesia Post Note  Patient: [name] Huang  Procedure(s) Performed: Procedure(s) (LRB): Repeat CESAREAN SECTION (N/A)  Patient location during evaluation: Mother Baby Anesthesia Type: Spinal Level of consciousness: awake and alert and oriented Pain management: satisfactory to patient Vital Signs Assessment: post-procedure vital signs reviewed and stable Respiratory status: spontaneous breathing, nonlabored ventilation and respiratory function stable Cardiovascular status: stable Postop Assessment: no headache, no backache, no signs of nausea or vomiting and adequate PO intake Anesthetic complications: no    Last Vitals:  Filed Vitals:   06/21/15 1315 06/21/15 1430  BP: 109/71 128/68  Pulse: 62 66  Temp: 36.8 C 36.6 C  Resp: 18 18    Last Pain:  Filed Vitals:   06/21/15 1615  PainSc: 5                  [name] Huang

## 2015-06-21 NOTE — Anesthesia Postprocedure Evaluation (Signed)
Anesthesia Post Note  Patient: [name] Huang  Procedure(s) Performed: Procedure(s) (LRB): Repeat CESAREAN SECTION (N/A)  Patient location during evaluation: PACU Anesthesia Type: Spinal Level of consciousness: awake and alert Pain management: pain level controlled Vital Signs Assessment: post-procedure vital signs reviewed and stable Respiratory status: spontaneous breathing, nonlabored ventilation, respiratory function stable and patient connected to nasal cannula oxygen Cardiovascular status: blood pressure returned to baseline and stable Postop Assessment: no signs of nausea or vomiting Anesthetic complications: no    Last Vitals:  Filed Vitals:   06/21/15 1315 06/21/15 1430  BP: 109/71 128/68  Pulse: 62 66  Temp: 36.8 C 36.6 C  Resp: 18 18    Last Pain:  Filed Vitals:   06/21/15 1447  PainSc: 6                  Diana Armijo JENNETTE

## 2015-06-21 NOTE — Transfer of Care (Signed)
Immediate Anesthesia Transfer of Care Note  Patient: [name] Huang  Procedure(s) Performed: Procedure(s) with comments: Repeat CESAREAN SECTION (N/A) - EDD: 06/28/15   Patient Location: PACU  Anesthesia Type:Spinal  Level of Consciousness: awake, alert , oriented and patient cooperative  Airway & Oxygen Therapy: Patient Spontanous Breathing  Post-op Assessment: Report given to RN and Post -op Vital signs reviewed and stable  Post vital signs: Reviewed and stable  Last Vitals:  Filed Vitals:   06/21/15 0820  BP: 132/86  Pulse: 101  Temp: 36.7 C  Resp: 20    Complications: No apparent anesthesia complications

## 2015-06-21 NOTE — Addendum Note (Signed)
Addendum  created 06/21/15 1620 by Jonna Munro, CRNA   Modules edited: Clinical Notes   Clinical Notes:  File: [id number]

## 2015-06-21 NOTE — Progress Notes (Signed)
Patient ID: [name] Huang, female   DOB: 1975/12/09, 40 y.o.   MRN: [id number] Patient seen and examined. Consent witnessed and signed. No changes noted. Update completed.

## 2015-06-21 NOTE — H&P (Signed)
[name] Huang is a 40 y.o. female presenting for rpt csection.  Maternal Medical History:  Fetal activity: Perceived fetal activity is normal.   Last perceived fetal movement was within the past hour.    Prenatal complications: Polyhydramnios.   Prenatal Complications - Diabetes: none.    OB History    Gravida Para Term Preterm AB TAB SAB Ectopic Multiple Living   5 2 2  0 2 0 2 0 0 2     Past Medical History  Diagnosis Date  . Asthma   . Abnormal colonoscopy     precancerous cells removed  . Mild hyperemesis gravidarum, antepartum   . IBS (irritable bowel syndrome)   . 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 PP care - 1C/S 5/21 09/23/2011  . Poison ivy   . Kidney stone   . Carpal tunnel syndrome   . GERD (gastroesophageal reflux disease)     with pregnancy  . PONV (postoperative nausea and vomiting)    Past Surgical History  Procedure Laterality Date  . Cesarean section  09/22/2011    Procedure: CESAREAN SECTION;  Surgeon: Lovenia Kim, MD;  Location: [facility];  Service: Gynecology;  Laterality: N/A;  . Wisdom tooth extraction    . Cesarean section N/A 04/04/2013    Procedure: Repeat CESAREAN SECTION;  Surgeon: Lovenia Kim, MD;  Location: [facility];  Service: Obstetrics;  Laterality: N/A;  EDD: 04/05/13  . Colonoscopy     Family History: family history includes Diabetes in her father and mother; Heart attack in her paternal grandfather; Heart disease in her paternal grandfather; Hypertension in her father and mother; Mental illness in her father; Peripheral vascular disease in her mother. There is no history of Anesthesia problems, Hypotension, Malignant hyperthermia, or Pseudochol deficiency. Social History:  reports that she quit smoking about 10 years ago. Her smoking use included Cigarettes. She has a 3 pack-year smoking history. She has never used smokeless tobacco. She reports that she drinks alcohol. She reports that she does not use  illicit drugs.   Prenatal Transfer Tool  Maternal Diabetes: No Genetic Screening: Normal Maternal Ultrasounds/Referrals: Normal Fetal Ultrasounds or other Referrals:  None Maternal Substance Abuse:  No Significant Maternal Medications:  None Significant Maternal Lab Results:  None Other Comments:  None  Review of Systems  Constitutional: Negative.   All other systems reviewed and are negative.     Blood pressure 132/86, pulse 101, temperature 98 F (36.7 C), temperature source Oral, resp. rate 20, SpO2 99 %, unknown if currently breastfeeding. Maternal Exam:  Uterine Assessment: Contraction strength is mild.  Contraction frequency is rare.   Abdomen: Patient reports no abdominal tenderness. Surgical scars: low transverse.   Fetal presentation: vertex  Introitus: Normal vulva. Normal vagina.  Ferning test: not done.  Nitrazine test: not done. Amniotic fluid character: not assessed.  Pelvis: questionable for delivery.   Cervix: Cervix evaluated by digital exam.     Physical Exam  Nursing note and vitals reviewed. Constitutional: She is oriented to person, place, and time. She appears well-developed and well-nourished.  HENT:  Head: Normocephalic and atraumatic.  Cardiovascular: Normal rate and regular rhythm.   Respiratory: Effort normal and breath sounds normal.  GI: Soft. Bowel sounds are normal.  Genitourinary: Vagina normal and uterus normal.  Musculoskeletal: Normal range of motion.  Neurological: She is alert and oriented to person, place, and time.  Skin: Skin is warm and dry.  Psychiatric: She has a normal mood and affect.  Prenatal labs: ABO, Rh: --/--/A POS (02/15 1030) Antibody: NEG (02/15 1030) Rubella: Immune (07/28 0000) RPR: Non Reactive (02/15 1032)  HBsAg: Negative (07/28 0000)  HIV: Non-reactive (07/28 0000)  GBS:     Assessment/Plan: 39 week IUP Previous csection x 2 Rpt csection Consent done Risks vs benefits of surgery noted.  Complications noted and discussed.   [name] Huang J 06/21/2015, 8:46 AM

## 2015-06-21 NOTE — Anesthesia Procedure Notes (Signed)
Spinal Patient location during procedure: OR Staffing Anesthesiologist: Roshan Salamon Performed by: anesthesiologist  Preanesthetic Checklist Completed: patient identified, site marked, surgical consent, pre-op evaluation, timeout performed, IV checked, risks and benefits discussed and monitors and equipment checked Spinal Block Patient position: sitting Prep: ChloraPrep Patient monitoring: continuous pulse ox, blood pressure and heart rate Approach: midline Location: L3-4 Injection technique: single-shot Needle Needle type: Pencan  Needle gauge: 24 G Needle length: 9 cm Additional Notes Functioning IV was confirmed and monitors were applied. Sterile prep and drape, including hand hygiene, mask and sterile gloves were used. The patient was positioned and the spine was prepped. The skin was anesthetized with lidocaine.  Free flow of clear CSF was obtained prior to injecting local anesthetic into the CSF.  The spinal needle aspirated freely following injection.  The needle was carefully withdrawn.  The patient tolerated the procedure well. Consent was obtained prior to procedure with all questions answered and concerns addressed. Risks including but not limited to bleeding, infection, nerve damage, paralysis, failed block, inadequate analgesia, allergic reaction, high spinal, itching and headache were discussed and the patient wished to proceed.   Lauretta Grill, MD

## 2015-06-21 NOTE — Lactation Note (Signed)
This note was copied from a baby's chart. Lactation Consultation Note  P3, Ex BF.  Breastfed first child for a few weeks then pumped for 11 months.  Breastfed 2nd child for 22 months. States she had difficulty latching first child due to flat nipples - nipples more evert now. Plans to allow only FOB and her to do STS and hold baby for now. Mother hand expressed and then latched baby in cradle.  Suggest she switch her to cross cradle. Sucks and some swallows observed w/ stimulation.  Encouraged mother to compress/massage breast. Discussed cluster feeding. Mom encouraged to feed baby 8-12 times/24 hours and with feeding cues.  Mom made aware of O/P services, breastfeeding support groups, community resources, and our phone # for post-discharge questions.  Mother states she would like to see lactation on day of discharge to check in - PRN.        Patient Name: Boy Saryia Gens [id number] Date: 06/21/2015 Reason for consult: Initial assessment   Maternal Data Has patient been taught Hand Expression?: Yes Does the patient have breastfeeding experience prior to this delivery?: Yes  Feeding Feeding Type: Breast Fed Length of feed: 10 min  LATCH Score/Interventions Latch: Grasps breast easily, tongue down, lips flanged, rhythmical sucking.  Audible Swallowing: A few with stimulation  Type of Nipple: Everted at rest and after stimulation  Comfort (Breast/Nipple): Soft / non-tender     Hold (Positioning): Assistance needed to correctly position infant at breast and maintain latch.  LATCH Score: 8  Lactation Tools Discussed/Used     Consult Status Consult Status: PRN    Carlye Grippe 06/21/2015, 2:58 PM

## 2015-06-22 LAB — CBC
HCT: 32.7 % — ABNORMAL LOW (ref 36.0–46.0)
HEMOGLOBIN: 10.8 g/dL — AB (ref 12.0–15.0)
MCH: 28.1 pg (ref 26.0–34.0)
MCHC: 33 g/dL (ref 30.0–36.0)
MCV: 84.9 fL (ref 78.0–100.0)
Platelets: 198 10*3/uL (ref 150–400)
RBC: 3.85 MIL/uL — AB (ref 3.87–5.11)
RDW: 13.8 % (ref 11.5–15.5)
WBC: 11.5 10*3/uL — AB (ref 4.0–10.5)

## 2015-06-22 NOTE — Progress Notes (Signed)
Pt refused blood draw at this time. Requested for lab to come back at 0700 to draw labs.

## 2015-06-22 NOTE — Progress Notes (Signed)
Patient ID: Marjon Ritacco, female   DOB: [birth year], 40 y.o.   MRN: [id number] Subjective: S/P Repeat Cesarean Delivery POD# 1 Information for the patient's newborn:  Westley, Lefler [id number]  female  / circ planning  Reports feeling well. Desires an early discharge home tomorrow. Feeding: breast Patient reports tolerating PO.  Breast symptoms: none Pain controlled with ibuprofen (OTC) and narcotic analgesics including Percocet Denies HA/SOB/C/P/N/V/dizziness. Flatus absent. No BM. She reports vaginal bleeding as normal, without clots.  She is ambulating, urinating without difficulty since last night.     Objective:   VS:  Filed Vitals:   06/21/15 1632 06/21/15 1948 06/22/15 0020 06/22/15 0355  BP: 115/57 118/65 134/80 128/64  Pulse: 101 70 75 76  Temp:  98 F (36.7 C) 98.2 F (36.8 C) 98.3 F (36.8 C)  TempSrc:  Oral Oral Oral  Resp:  18 18 18   SpO2: 98% 100% 100% 100%     Intake/Output Summary (Last 24 hours) at 06/22/15 0935 Last data filed at 06/22/15 0315  Gross per 24 hour  Intake 1967.5 ml  Output   7200 ml  Net -5232.5 ml        Recent Labs  06/19/15 1032 06/22/15 0648  WBC 10.9* 11.5*  HGB 12.5 10.8*  HCT 37.3 32.7*  PLT 251 198     Blood type: --/--/A POS (02/15 1030)  Rubella: Immune (07/28 0000)     Physical Exam:   General: alert, cooperative and no distress  CV: Regular rate and rhythm, S1S2 present or without murmur or extra heart sounds  Resp: clear  Abdomen: soft, nontender, normal bowel sounds  Incision: serous drainage present covering 3/4 of Honeycomb dressing  Uterine Fundus: firm, 1 FB below umbilicus, nontender  Lochia: minimal  Ext: edema trace and Homans sign is negative, no sign of DVT   Assessment/Plan: 39 y.o.   POD# 1.  S/P Cesarean Delivery.  Indications: repeat                Principal Problem:   Postpartum care following cesarean delivery (2/17)  Doing well, stable.               Regular diet as  tolerated D/C foley per unit protocol Ambulate Routine post-op care Consider early d/c home tomorrow  Laury Deep, M, MSN, CNM 06/22/2015, 9:35 AM

## 2015-06-22 NOTE — Lactation Note (Signed)
This note was copied from a baby's chart. Lactation Consultation Note  Patient Name: [name] Huang [id number] Date: 06/22/2015 Reason for consult: Follow-up assessment   Follow up with mom of 30 hour old infant. Infant with 11 Bf for 10-55 minutes. 2 attempts, 6 voids and 10 stools in last 24 hours. His weight is 8 lb 1 oz with a weight loss of 2 % since birth. Mom reports that stools are small in size each time. Mom reports that she is experiencing nipple tenderness with latch that improves with feeding. She reports infant is sleepy at breast requiring stimulation with feeding. She is using EBM to nipples. She says she is not feeling fuller today and she usually gets her milk in on about day 2.5. Mom has no questions at this time. She would like to see Lactation prior to d/c home tomorrow. Enc mom to call with questions/concerns.    Maternal Data Formula Feeding for Exclusion: No Does the patient have breastfeeding experience prior to this delivery?: Yes  Feeding    LATCH Score/Interventions                      Lactation Tools Discussed/Used     Consult Status Consult Status: Follow-up Date: 06/23/15 Follow-up type: In-patient    [name] Huang 06/22/2015, 10:11 PM

## 2015-06-23 DIAGNOSIS — Z98891 History of uterine scar from previous surgery: Secondary | ICD-10-CM

## 2015-06-23 LAB — TYPE AND SCREEN
ABO/RH(D): A POS
Antibody Screen: NEGATIVE
UNIT DIVISION: 0
Unit division: 0

## 2015-06-23 NOTE — Progress Notes (Signed)
POD# 2  S: Pt notes pain controlled w/ po meds but overall more pain today than yesterday, no longer wants early d/c due to pain control, minimal lochia, nl void, out of bed w/o dizziness or chest pain, tol reg po, + flatus. Pt is  breastfeeding  Filed Vitals:   06/22/15 0355 06/22/15 0800 06/22/15 1900 06/23/15 1006  BP: 128/64 122/65 123/67 139/79  Pulse: 76 71 74 89  Temp: 98.3 F (36.8 C) 98.1 F (36.7 C) 98.3 F (36.8 C) 98.2 F (36.8 C)  TempSrc: Oral Oral Oral Oral  Resp: 18 18 20 18   SpO2: 100%  98%     Gen: well appearing, obese CV: RRR Pulm: CTAB Abd: soft, ND, approp tender, fundus below umbilicus, NT Inc: C/D/I, sutures LE: tr edema, NT  CBC    Component Value Date/Time   WBC 11.5* 06/22/2015 0648   RBC 3.85* 06/22/2015 0648   HGB 10.8* 06/22/2015 0648   HCT 32.7* 06/22/2015 0648   PLT 198 06/22/2015 0648   MCV 84.9 06/22/2015 0648   MCH 28.1 06/22/2015 0648   MCHC 33.0 06/22/2015 0648   RDW 13.8 06/22/2015 0648    A/P: POD#  2 s/p RCS x 3 - post-op. Doing well.  - Continue Dilaudid for pain management. 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 06/23/2015 11:52 AM

## 2015-06-23 NOTE — Lactation Note (Signed)
This note was copied from a baby's chart. Lactation Consultation Note  7.7% Weight loss.  FOB states baby clustered fed last night and parents are exhausted. Suggest they call if they would like assistance w/ latching and answer questions.  Patient Name: [name] Huang [id number] Date: 06/23/2015     Maternal Data    Feeding    LATCH Score/Interventions Latch:  (declines to call to see latch this a.m.)                    Lactation Tools Discussed/Used     Consult Status      Vivianne Master [facility] 06/23/2015, 9:07 AM

## 2015-06-23 NOTE — Op Note (Signed)
Patient ID: [name] Huang, female   DOB: 09/27/1975, 40 y.o.   MRN: [id number] Cesarean Section Procedure Note  Indications: previous uterine incision kerr x2  Pre-operative Diagnosis: 39 week 0 day pregnancy.  Post-operative Diagnosis: same  Surgeon: Lovenia Kim   Assistants: Drake Leach, cnm  Anesthesia: Local anesthesia 0.25.% bupivacaine and Spinal anesthesia  ASA Class: 2  Procedure Details  The patient was seen in the Holding Room. The risks, benefits, complications, treatment options, and expected outcomes were discussed with the patient.  The patient concurred with the proposed plan, giving informed consent. The risks of anesthesia, infection, bleeding and possible injury to other organs discussed. Injury to bowel, bladder, or ureter with possible need for repair discussed. Possible need for transfusion with secondary risks of hepatitis or HIV acquisition discussed. Post operative complications to include but not limited to DVT, PE and Pneumonia noted. The site of surgery properly noted/marked. The patient was taken to Operating Room # 9, identified as [name] and the procedure verified as C-Section Delivery. A Time Out was held and the above information confirmed.  After induction of anesthesia, the patient was draped and prepped in the usual sterile manner. A Pfannenstiel incision was made and carried down through the subcutaneous tissue to the fascia. Fascial incision was made and extended transversely using Mayo scissors. The fascia was separated from the underlying rectus tissue superiorly and inferiorly. The peritoneum was identified and entered. Peritoneal incision was extended longitudinally. The utero-vesical peritoneal reflection was incised transversely and the bladder flap was bluntly freed from the lower uterine segment. A low transverse uterine incision(Kerr hysterotomy) was made. Delivered from OA presentation was a  female with Apgar scores of 9 at one minute and  9 at five minutes. Bulb suctioning gently performed. Neonatal team in attendance.After the umbilical cord was clamped and cut cord blood was obtained for evaluation. The placenta was removed intact and appeared normal. The uterus was curetted with a dry lap pack. Good hemostasis was noted.The uterine outline, tubes and ovaries appeared normal. The uterine incision was closed with running locked sutures of 0 Monocryl x 2 layers. Hemostasis was observed. Lavage was carried out until clear.The parietal peritoneum was closed with a running 2-0 Monocryl suture. The fascia was then reapproximated with running sutures of 0 Monocryl. The skin was reapproximated with 3-0 monocryl after Parker closure with 2-0 plain.  Instrument, sponge, and needle counts were correct prior the abdominal closure and at the conclusion of the case.   Findings: As noted  Estimated Blood Loss:  300 mL         Drains: foley                 Specimens: placenta                 Complications:  None; patient tolerated the procedure well.         Disposition: PACU - hemodynamically stable.         Condition: stable  Attending Attestation: I performed the procedure.

## 2015-06-24 ENCOUNTER — Encounter (HOSPITAL_COMMUNITY): Payer: Self-pay | Admitting: Obstetrics and Gynecology

## 2015-06-24 MED ORDER — IBUPROFEN 600 MG PO TABS: 600.0000 mg | ORAL_TABLET | Freq: Four times a day (QID) | ORAL | Status: DC

## 2015-06-24 MED ORDER — HYDROMORPHONE HCL 4 MG PO TABS: 4.0000 mg | ORAL_TABLET | ORAL | Status: DC | PRN

## 2015-06-24 MED ORDER — SERTRALINE HCL 50 MG PO TABS: 50.0000 mg | ORAL_TABLET | Freq: Every day | ORAL | Status: DC

## 2015-06-24 NOTE — Progress Notes (Signed)
POD # 3  Subjective: Pt reports feeling well, ready for discharge/ Pain controlled with Motrin and Dilaudid Tolerating po/Voiding without problems/ No n/v/ Flatus present, +BM Activity: ad lib Bleeding is light Newborn info:  Information for the patient's newborn:  Tashona, Jalil [id number]  female  / Circumcision: done/ Feeding: breast  Objective: VS: VS:  Filed Vitals:   06/22/15 1900 06/23/15 1006 06/23/15 1755 06/24/15 0551  BP: 123/67 139/79 126/75 130/86  Pulse: 74 89 87 90  Temp: 98.3 F (36.8 C) 98.2 F (36.8 C) 98.5 F (36.9 C) 98.3 F (36.8 C)  TempSrc: Oral Oral Oral Oral  Resp: 20 18 18 20   SpO2: 98%  100%     I&O: Intake/Output    None     LABS:  Recent Labs  06/22/15 0648  WBC 11.5*  HGB 10.8*  PLT 198   Blood type: --/--/A POS (02/15 1030) Rubella: Immune (07/28 0000)           Physical Exam:  General: alert, cooperative and no distress CV: Regular rate and rhythm Resp: CTA bilaterally Abdomen: soft, nontender, normal bowel sounds Incision: Covered with Tegaderm and honeycomb dressing; no significant drainage, edema, bruising, or erythema; well approximated with suture Uterine Fundus: firm, below umbilicus, nontender Lochia: minimal Ext: extremities normal, atraumatic, no cyanosis or edema and Homans sign is negative, no sign of DVT   Assessment: POD # 3/ [id number]/ S/P C/Section d/t repeat Doing well and stable for discharge home 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

## 2015-06-24 NOTE — Discharge Summary (Signed)
DISCHARGE SUMMARY:  Patient ID: [name] Huang MRN: [id number] DOB/AGE: May 14, 1975 40 y.o.  Admit date: 06/21/2015 Admission Diagnoses: [redacted] weeks gestation, previous CS   Discharge date: 06/24/2015 Discharge Diagnoses: S/P C/S on 06/21/15        Prenatal history: [id number]   EDC: 06/28/2015, by Other Basis  Prenatal care at [facility] since [redacted] wks gestation. Primary provider: Dr. Ronita Hipps Prenatal course complicated by AMA, polyhydramnios, previous CS x2, hx of PPD with first pregnancy  Prenatal labs: ABO, Rh: --/--/A POS (02/15 1030)  Antibody: NEG (02/15 1030) Rubella: Immune  RPR: Non Reactive (02/15 1032)  HBsAg: Negative (07/28 0000)  HIV: Non-reactive (07/28 0000)  GBS:  Neg  GTT: 115  Medical / Surgical History :  Past medical history:  Past Medical History  Diagnosis Date  . Asthma   . Abnormal colonoscopy     precancerous cells removed  . Mild hyperemesis gravidarum, antepartum   . IBS (irritable bowel syndrome)   . 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 PP care - 1C/S 5/21 09/23/2011  . Poison ivy   . Kidney stone   . Carpal tunnel syndrome   . GERD (gastroesophageal reflux disease)     with pregnancy  . PONV (postoperative nausea and vomiting)     Past surgical history:  Past Surgical History  Procedure Laterality Date  . Cesarean section  09/22/2011    Procedure: CESAREAN SECTION;  Surgeon: Lovenia Kim, MD;  Location: [facility];  Service: Gynecology;  Laterality: N/A;  . Wisdom tooth extraction    . Cesarean section N/A 04/04/2013    Procedure: Repeat CESAREAN SECTION;  Surgeon: Lovenia Kim, MD;  Location: [facility];  Service: Obstetrics;  Laterality: N/A;  EDD: 04/05/13  . Colonoscopy       Medications on Admission: Prescriptions prior to admission  Medication Sig Dispense Refill Last Dose  . acetaminophen (TYLENOL) 500 MG tablet Take 1,000 mg by mouth every 6 (six) hours as needed for mild pain or  headache. For pain   Past Week at Unknown time  . albuterol (PROVENTIL HFA;VENTOLIN HFA) 108 (90 BASE) MCG/ACT inhaler Inhale 2 puffs into the lungs every 6 (six) hours as needed for wheezing or shortness of breath.    06/21/2015 at 0800  . calcium carbonate (TUMS - DOSED IN MG ELEMENTAL CALCIUM) 500 MG chewable tablet Chew 2 tablets by mouth 3 (three) times daily as needed for indigestion or heartburn.   06/20/2015 at Unknown time  . docusate sodium (COLACE) 50 MG capsule Take 50 mg by mouth 2 (two) times daily.   06/20/2015 at Unknown time  . doxylamine, Sleep, (UNISOM) 25 MG tablet Take 25 mg by mouth at bedtime as needed.   06/20/2015 at Unknown time  . Prenatal Vit-Fe Fumarate-FA (PRENATAL MULTIVITAMIN) TABS Take 1 tablet by mouth daily.    Past Week at Unknown time    Allergies: Amoxicillin and Codeine   Intrapartum Course:  Admitted for scheduled repeat CS under regional anesthesia.   Postpartum Course: Uncomplicated, discharge home on POD #3   Physical Exam:   VSS: Blood pressure 130/86, pulse 90, temperature 98.3 F (36.8 C), temperature source Oral, resp. rate 20, SpO2 100 %, unknown if currently breastfeeding.  LABS:  Recent Labs  06/22/15 0648  WBC 11.5*  HGB 10.8*  PLT 198    General: alert and oriented x3 Heart: RRR Lungs: CTA bilaterally GI: soft, non-tender, non-distended, BS x4 Lochia: small Uterus: firm below  umbilicus Incision: well approximated with suture / honeycomb dressing-no significant erythema, drainage, or edema Extremities: No edema, Homans neg   Newborn Data Live born female  Birth Weight: 8 lb 4.1 oz (3745 g) APGAR: 9, 9  See operative report for further details  Home with mother.  Discharge Instructions:  Wound Care: keep clean and dry / remove honeycomb POD 6 Postpartum Instructions: [facility] discharge booklet - instructions reviewed Medications:    Medication List    STOP taking these medications        acetaminophen 500 MG tablet   Commonly known as:  TYLENOL     calcium carbonate 500 MG chewable tablet  Commonly known as:  TUMS - dosed in mg elemental calcium     docusate sodium 50 MG capsule  Commonly known as:  COLACE     doxylamine (Sleep) 25 MG tablet  Commonly known as:  UNISOM      TAKE these medications        albuterol 108 (90 Base) MCG/ACT inhaler  Commonly known as:  PROVENTIL HFA;VENTOLIN HFA  Inhale 2 puffs into the lungs every 6 (six) hours as needed for wheezing or shortness of breath.     HYDROmorphone 4 MG tablet  Commonly known as:  DILAUDID  Take 1 tablet (4 mg total) by mouth every 4 (four) hours as needed for severe pain.     ibuprofen 600 MG tablet  Commonly known as:  ADVIL,MOTRIN  Take 1 tablet (600 mg total) by mouth every 6 (six) hours.     prenatal multivitamin Tabs tablet  Take 1 tablet by mouth daily.          Zoloft 50 mg 1 tablet po daily #30, refill x1      Follow-up Information    Follow up with Lovenia Kim, MD. Schedule an appointment as soon as possible for a visit in 6 weeks.   Specialty:  Obstetrics and Gynecology   Contact information:   [address] [place] [address] [phone]         Signed: Julianne Handler, Delane Ginger MSN, CNM 06/24/2015, 10:44 AM

## 2015-06-24 NOTE — Lactation Note (Signed)
This note was copied from a baby's chart. Lactation Consultation Note  Patient Name: [name] Huang [id number] Date: 06/24/2015 Reason for consult: Follow-up assessment;Infant weight loss;Other (Comment) (10 % weight loss , see LC note , permom milk is in , breast fuller )  Mom requested for LC to check latch @ consult . Dad changed the diaper ( void and stool ) , and mom latched the baby with depth. LC changed diaper, lip  Lines flanged ( upper and lower) , multiply swallows noted, increased with breast compressions. Baby fed for 15 mins, and mom released suction due to baby becoming  Non - nutritive after 15 mins and very relaxed. Nipple slightly slanted and per mom feeding and latch comfortable.  Sore nipple and engorgement prevention and tx reviewed. Mom does well with hand expressing , and has a pump at home.  Mother informed of post-discharge support and given phone number to the lactation department, including services for phone call assistance; out-patient appointments; and breastfeeding support group. List of other breastfeeding resources in the community given in the handout. Encouraged mother to call for problems or concerns related to breastfeeding.   Maternal Data Has patient been taught Hand Expression?: Yes (mom hand expresses well., great milk flow ) Does the patient have breastfeeding experience prior to this delivery?: Yes  Feeding Feeding Type: Breast Fed Length of feed: 15 min (multiply swallows , increased with breast compressions )  LATCH Score/Interventions Latch: Grasps breast easily, tongue down, lips flanged, rhythmical sucking. Intervention(s): Adjust position;Assist with latch;Breast massage;Breast compression  Audible Swallowing: Spontaneous and intermittent Intervention(s): Hand expression  Type of Nipple: Everted at rest and after stimulation  Comfort (Breast/Nipple): Filling, red/small blisters or bruises, mild/mod discomfort  Problem noted:  Filling  Hold (Positioning): No assistance needed to correctly position infant at breast. Intervention(s): Breastfeeding basics reviewed;Support Pillows;Position options;Skin to skin  LATCH Score: 9  Lactation Tools Discussed/Used WIC Program: No   Consult Status Consult Status: Complete Date: 06/24/15    Myer Haff 06/24/2015, 11:52 AM

## 2015-06-24 NOTE — Lactation Note (Signed)
This note was copied from a baby's chart. Lactation Consultation Note Mom is a experienced BF mom, she has a 38 yr old and 72 yr old which she BF both for 2 yrs. Denies any difficulties or challenges. States her milk is coming in an is excited d/t coming in earlier than usual. Mom reports + breast changes w/pregnancy. Reviewed Baby & Me book's Breastfeeding Basics, STS, positioning, obtaining a deep latch, cluster feeding, supply and demand. Mom is very knowledgeable about BF and enjoys BF. Mom has a DEBP at home.[place] brochure given w/resources, support groups and [place] services. Mom would like LC to come this morning to see a latch before discharge. Mom didn't want to do that now d/t just got baby to sleep and would like to rest. Patient Name: [name] Huang [id number] Date: 06/24/2015 Reason for consult: Follow-up assessment   Maternal Data    Feeding Feeding Type: Breast Fed Length of feed: 50 min  LATCH Score/Interventions                      Lactation Tools Discussed/Used     Consult Status Consult Status: Follow-up (requestion to observe latch before discharged) Date: 06/24/15 Follow-up type: In-patient    Theodoro Kalata 06/24/2015, 4:40 AM

## 2015-11-12 ENCOUNTER — Other Ambulatory Visit: Payer: Self-pay | Admitting: Family Medicine

## 2015-11-12 DIAGNOSIS — Z8249 Family history of ischemic heart disease and other diseases of the circulatory system: Secondary | ICD-10-CM

## 2015-11-26 ENCOUNTER — Ambulatory Visit
Admission: RE | Admit: 2015-11-26 | Discharge: 2015-11-26 | Disposition: A | Discharge status: Home / Self Care | Payer: BLUE CROSS/BLUE SHIELD | Source: Ambulatory Visit | Attending: Family Medicine | Admitting: Family Medicine

## 2015-11-26 DIAGNOSIS — Z8249 Family history of ischemic heart disease and other diseases of the circulatory system: Secondary | ICD-10-CM

## 2016-04-06 ENCOUNTER — Ambulatory Visit (INDEPENDENT_AMBULATORY_CARE_PROVIDER_SITE_OTHER): Payer: BLUE CROSS/BLUE SHIELD | Admitting: Podiatry

## 2016-04-06 ENCOUNTER — Encounter: Payer: Self-pay | Admitting: Podiatry

## 2016-04-06 ENCOUNTER — Ambulatory Visit (INDEPENDENT_AMBULATORY_CARE_PROVIDER_SITE_OTHER): Payer: BLUE CROSS/BLUE SHIELD

## 2016-04-06 VITALS — BP 112/80 | HR 92 | Resp 16 | Ht 63.0 in | Wt 177.0 lb

## 2016-04-06 DIAGNOSIS — M79671 Pain in right foot: Secondary | ICD-10-CM | POA: Diagnosis not present

## 2016-04-06 DIAGNOSIS — M25571 Pain in right ankle and joints of right foot: Secondary | ICD-10-CM

## 2016-04-06 DIAGNOSIS — M79672 Pain in left foot: Secondary | ICD-10-CM | POA: Diagnosis not present

## 2016-04-06 DIAGNOSIS — M25572 Pain in left ankle and joints of left foot: Secondary | ICD-10-CM

## 2016-04-06 DIAGNOSIS — M779 Enthesopathy, unspecified: Secondary | ICD-10-CM

## 2016-04-06 DIAGNOSIS — M7750 Other enthesopathy of unspecified foot: Secondary | ICD-10-CM

## 2016-04-06 MED ORDER — TRIAMCINOLONE ACETONIDE 10 MG/ML IJ SUSP
10.0000 mg | Freq: Once | INTRAMUSCULAR | Status: AC
  Administered 2016-04-06: 10 mg

## 2016-04-06 NOTE — Progress Notes (Signed)
   Subjective:    Patient ID: [name] Huang, female    DOB: 12-Oct-1975, 40 y.o.   MRN: [id number]  HPI Chief Complaint  Patient presents with  . Foot Pain    Bilateral; dorsal & entire bottom of feet; x9 months  . Ankle Pain    Bilateral; lateral side; pt stated, "has had swelling"; x9 months      Review of Systems  All other systems reviewed and are negative.      Objective:   Physical Exam        Assessment & Plan:

## 2016-04-08 NOTE — Progress Notes (Signed)
Subjective:     Patient ID: [name] Huang, female   DOB: 02-Aug-1975, 40 y.o.   MRN: [id number]  HPI patient presents stating she's had a 9 month history of significant discomfort in both feet with the ankles being the worse and that also her feet in general. States that this is been an ongoing problem after having fracture 10 years ago but there seems to be worsening of symptoms over the last year   Review of Systems  All other systems reviewed and are negative.      Objective:   Physical Exam  Constitutional: She is oriented to person, place, and time.  Cardiovascular: Intact distal pulses.   Musculoskeletal: Normal range of motion.  Neurological: She is oriented to person, place, and time.  Skin: Skin is warm.  Nursing note and vitals reviewed.  neurovascular status intact muscle strength adequate range of motion within normal limits with patient found to have exquisite discomfort in the capsule of the sinus tarsi bilateral with fluid buildup and pain when palpated. 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 At this point I did go ahead and I injected the sinus tarsi bilateral 3 Milligan Kenalog 5 mill grams Xylocaine and applied fascial brace bilateral to reduce pressure on the sides. Explained physical therapy and will reappoint for [place] to recheck again in the next several weeks or earlier if needed and also placed on oral anti-inflammatory  X-rays were negative for signs of acute fracture or other condition with mild arthritic conditions noticed

## 2016-04-17 ENCOUNTER — Encounter: Payer: Self-pay | Admitting: Podiatry

## 2016-04-17 ENCOUNTER — Ambulatory Visit (INDEPENDENT_AMBULATORY_CARE_PROVIDER_SITE_OTHER): Payer: BLUE CROSS/BLUE SHIELD | Admitting: Podiatry

## 2016-04-17 DIAGNOSIS — M25571 Pain in right ankle and joints of right foot: Secondary | ICD-10-CM

## 2016-04-17 DIAGNOSIS — M25572 Pain in left ankle and joints of left foot: Secondary | ICD-10-CM | POA: Diagnosis not present

## 2016-04-17 DIAGNOSIS — M779 Enthesopathy, unspecified: Secondary | ICD-10-CM

## 2016-04-17 DIAGNOSIS — M7750 Other enthesopathy of unspecified foot: Secondary | ICD-10-CM

## 2016-04-17 NOTE — Progress Notes (Signed)
Subjective:     Patient ID: [name] Huang, female   DOB: 03/19/1976, 40 y.o.   MRN: [id number]  HPI patient states that her ankles are feeling quite a bit better but she still gets heel pain and foot pain to a degree. States that she is walking with a better heel toe gait   Review of Systems     Objective:   Physical Exam Neurovascular status intact muscle strength adequate with inflammation in the sinus tarsi bilateral that's improved but still present with discomfort forefoot that's improved    Assessment:     Doing well but I'm still concerned about young age and mechanical dysfunction and length of time that she's had this problem    Plan:     H&P condition reviewed and discussed. I do expect at one point in the future reoccurrence but I'm hoping that we'll be in the distant future. I did scan for a customized orthotic with 12 mm heel seat of a Berkley type in order to try to provide support and prevent reoccurrence

## 2016-05-21 ENCOUNTER — Ambulatory Visit: Payer: BLUE CROSS/BLUE SHIELD | Admitting: Podiatry

## 2016-08-04 ENCOUNTER — Other Ambulatory Visit: Payer: BLUE CROSS/BLUE SHIELD

## 2016-10-20 ENCOUNTER — Encounter: Payer: Self-pay | Admitting: Gastroenterology

## 2016-10-26 ENCOUNTER — Encounter: Payer: Self-pay | Admitting: Gastroenterology

## 2016-12-14 ENCOUNTER — Ambulatory Visit (AMBULATORY_SURGERY_CENTER): Payer: Self-pay

## 2016-12-14 VITALS — Ht 62.25 in | Wt 182.5 lb

## 2016-12-14 DIAGNOSIS — Z1211 Encounter for screening for malignant neoplasm of colon: Secondary | ICD-10-CM

## 2016-12-14 MED ORDER — SUPREP BOWEL PREP KIT 17.5-3.13-1.6 GM/177ML PO SOLN: 1.0000 | Freq: Once | ORAL | 0 refills | Status: AC

## 2016-12-14 NOTE — Progress Notes (Signed)
Egg allergy causes GI only No past problems with anesthesia except PONV with general anesthesia Narcotics usually cause severe nausea No diet meds No home oxygen  Registered emmi

## 2016-12-16 ENCOUNTER — Encounter: Payer: Self-pay | Admitting: Gastroenterology

## 2016-12-28 ENCOUNTER — Encounter: Payer: Self-pay | Admitting: Gastroenterology

## 2016-12-28 ENCOUNTER — Ambulatory Visit (AMBULATORY_SURGERY_CENTER): Payer: BLUE CROSS/BLUE SHIELD | Admitting: Gastroenterology

## 2016-12-28 VITALS — BP 105/71 | HR 67 | Temp 98.6°F | Resp 12 | Ht 63.0 in | Wt 177.0 lb

## 2016-12-28 DIAGNOSIS — Z86010 Personal history of colonic polyps: Secondary | ICD-10-CM | POA: Diagnosis present

## 2016-12-28 DIAGNOSIS — D120 Benign neoplasm of cecum: Secondary | ICD-10-CM

## 2016-12-28 DIAGNOSIS — Z1211 Encounter for screening for malignant neoplasm of colon: Secondary | ICD-10-CM

## 2016-12-28 MED ORDER — SODIUM CHLORIDE 0.9 % IV SOLN: 500.0000 mL | INTRAVENOUS | Status: DC

## 2016-12-28 NOTE — Progress Notes (Signed)
Report to PACU, RN, vss, BBS= Clear.  

## 2016-12-28 NOTE — Patient Instructions (Signed)
   INFORMATION ON POLYPS AND HEMORRHOIDS GIVEN TO YOU TODAY  AWAIT PATHOLOGY RESULTS    YOU HAD AN ENDOSCOPIC PROCEDURE TODAY AT [facility]:   Refer to the procedure report that was given to you for any specific questions about what was found during the examination.  If the procedure report does not answer your questions, please call your gastroenterologist to clarify.  If you requested that your care partner not be given the details of your procedure findings, then the procedure report has been included in a sealed envelope for you to review at your convenience later.  YOU SHOULD EXPECT: Some feelings of bloating in the abdomen. Passage of more gas than usual.  Walking can help get rid of the air that was put into your GI tract during the procedure and reduce the bloating. If you had a lower endoscopy (such as a colonoscopy or flexible sigmoidoscopy) you may notice spotting of blood in your stool or on the toilet paper. If you underwent a bowel prep for your procedure, you may not have a normal bowel movement for a few days.  Please Note:  You might notice some irritation and congestion in your nose or some drainage.  This is from the oxygen used during your procedure.  There is no need for concern and it should clear up in a day or so.  SYMPTOMS TO REPORT IMMEDIATELY:   Following lower endoscopy (colonoscopy or flexible sigmoidoscopy):  Excessive amounts of blood in the stool  Significant tenderness or worsening of abdominal pains  Swelling of the abdomen that is new, acute  Fever of 100F or higher    For urgent or emergent issues, a gastroenterologist can be reached at any hour by calling [phone].   DIET:  We do recommend a small meal at first, but then you may proceed to your regular diet.  Drink plenty of fluids but you should avoid alcoholic beverages for 24 hours.  ACTIVITY:  You should plan to take it easy for the rest of today and you should NOT DRIVE  or use heavy machinery until tomorrow (because of the sedation medicines used during the test).    FOLLOW UP: Our staff will call the number listed on your records the next business day following your procedure to check on you and address any questions or concerns that you may have regarding the information given to you following your procedure. If we do not reach you, we will leave a message.  However, if you are feeling well and you are not experiencing any problems, there is no need to return our call.  We will assume that you have returned to your regular daily activities without incident.  If any biopsies were taken you will be contacted by phone or by letter within the next 1-3 weeks.  Please call us at [phone] if you have not heard about the biopsies in 3 weeks.    SIGNATURES/CONFIDENTIALITY: You and/or your care partner have signed paperwork which will be entered into your electronic medical record.  These signatures attest to the fact that that the information above on your After Visit Summary has been reviewed and is understood.  Full responsibility of the confidentiality of this discharge information lies with you and/or your care-partner.

## 2016-12-28 NOTE — Progress Notes (Signed)
Pt's states no medical or surgical changes since previsit or office visit. Patient stating she is able to eat foods containing eggs and egg products.

## 2016-12-28 NOTE — Progress Notes (Signed)
Called to room to assist during endoscopic procedure.  Patient ID and intended procedure confirmed with present staff. Received instructions for my participation in the procedure from the performing physician.  

## 2016-12-28 NOTE — Op Note (Signed)
[facility] Patient Name: [name] Huang Procedure Date: 12/28/2016 8:47 AM MRN: [id number] Endoscopist: Mauri Pole , MD Age: 41 Referring MD:  Date of Birth: March 12, 1976 Gender: Female Account #: [id number] Procedure:                Colonoscopy Indications:              High risk colon cancer surveillance: Personal                            history of colonic polyps Medicines:                Monitored Anesthesia Care Procedure:                Pre-Anesthesia Assessment:                           - Prior to the procedure, a History and Physical                            was performed, and patient medications and                            allergies were reviewed. The patient's tolerance of                            previous anesthesia was also reviewed. The risks                            and benefits of the procedure and the sedation                            options and risks were discussed with the patient.                            All questions were answered, and informed consent                            was obtained. Prior Anticoagulants: The patient has                            taken no previous anticoagulant or antiplatelet                            agents. ASA Grade Assessment: II - A patient with                            mild systemic disease. After reviewing the risks                            and benefits, the patient was deemed in                            satisfactory condition to undergo the procedure.  After obtaining informed consent, the colonoscope                            was passed under direct vision. Throughout the                            procedure, the patient's blood pressure, pulse, and                            oxygen saturations were monitored continuously. The                            Colonoscope was introduced through the anus and                            advanced to the the cecum,  identified by                            appendiceal orifice and ileocecal valve. The                            colonoscopy was performed without difficulty. The                            patient tolerated the procedure well. The quality                            of the bowel preparation was excellent. The                            ileocecal valve, appendiceal orifice, and rectum                            were photographed. Scope In: 8:54:14 AM Scope Out: 9:07:25 AM Scope Withdrawal Time: 0 hours 7 minutes 19 seconds  Total Procedure Duration: 0 hours 13 minutes 11 seconds  Findings:                 The perianal and digital rectal examinations were                            normal.                           A 4 mm polyp was found in the cecum. The polyp was                            sessile. The polyp was removed with a cold snare.                            Resection and retrieval were complete.                           Non-bleeding internal hemorrhoids were found during  retroflexion. The hemorrhoids were small.                           The exam was otherwise without abnormality. Complications:            No immediate complications. Estimated Blood Loss:     Estimated blood loss was minimal. Impression:               - One 4 mm polyp in the cecum, removed with a cold                            snare. Resected and retrieved.                           - Non-bleeding internal hemorrhoids.                           - The examination was otherwise normal. Recommendation:           - Patient has a contact number available for                            emergencies. The signs and symptoms of potential                            delayed complications were discussed with the                            patient. Return to normal activities tomorrow.                            Written discharge instructions were provided to the                             patient.                           - Resume previous diet.                           - Continue present medications.                           - Await pathology results.                           - Repeat colonoscopy in 5 years for surveillance                            based on pathology results. Mauri Pole, MD 12/28/2016 9:12:05 AM This report has been signed electronically.

## 2016-12-29 ENCOUNTER — Telehealth: Payer: Self-pay | Admitting: *Deleted

## 2016-12-29 NOTE — Telephone Encounter (Signed)
No answer for follow up call will attempt to call back later this afternoon. SM

## 2016-12-29 NOTE — Telephone Encounter (Signed)
  Follow up Call-  Call back number 12/28/2016  Post procedure Call Back phone  # [phone]  Permission to leave phone message Yes  Some recent data might be hidden     Patient questions:  Do you have a fever, pain , or abdominal swelling? No. Pain Score  0 *  Have you tolerated food without any problems? Yes.    Have you been able to return to your normal activities? Yes.    Do you have any questions about your discharge instructions: Diet   No. Medications  No. Follow up visit  No.  Do you have questions or concerns about your Care? No.  Actions: * If pain score is 4 or above: No action needed, pain <4.

## 2017-01-05 ENCOUNTER — Encounter: Payer: Self-pay | Admitting: Gastroenterology

## 2018-01-14 ENCOUNTER — Other Ambulatory Visit: Payer: Self-pay | Admitting: Family Medicine

## 2018-01-14 ENCOUNTER — Ambulatory Visit
Admission: RE | Admit: 2018-01-14 | Discharge: 2018-01-14 | Disposition: A | Discharge status: Home / Self Care | Payer: Self-pay | Source: Ambulatory Visit | Attending: Family Medicine | Admitting: Family Medicine

## 2018-01-14 DIAGNOSIS — M79662 Pain in left lower leg: Principal | ICD-10-CM

## 2018-01-14 DIAGNOSIS — M79661 Pain in right lower leg: Secondary | ICD-10-CM

## 2018-02-21 ENCOUNTER — Other Ambulatory Visit: Payer: Self-pay | Admitting: Obstetrics and Gynecology

## 2018-02-21 DIAGNOSIS — R928 Other abnormal and inconclusive findings on diagnostic imaging of breast: Secondary | ICD-10-CM

## 2018-02-22 ENCOUNTER — Other Ambulatory Visit: Payer: Self-pay | Admitting: Obstetrics and Gynecology

## 2018-02-22 DIAGNOSIS — R928 Other abnormal and inconclusive findings on diagnostic imaging of breast: Secondary | ICD-10-CM

## 2018-02-24 ENCOUNTER — Ambulatory Visit
Admission: RE | Admit: 2018-02-24 | Discharge: 2018-02-24 | Disposition: A | Discharge status: Home / Self Care | Payer: No Typology Code available for payment source | Source: Ambulatory Visit | Attending: Obstetrics and Gynecology | Admitting: Obstetrics and Gynecology

## 2018-02-24 ENCOUNTER — Other Ambulatory Visit: Payer: Self-pay | Admitting: Obstetrics and Gynecology

## 2018-02-24 DIAGNOSIS — R928 Other abnormal and inconclusive findings on diagnostic imaging of breast: Secondary | ICD-10-CM

## 2018-02-24 DIAGNOSIS — R921 Mammographic calcification found on diagnostic imaging of breast: Secondary | ICD-10-CM

## 2018-03-02 ENCOUNTER — Ambulatory Visit
Admission: RE | Admit: 2018-03-02 | Discharge: 2018-03-02 | Disposition: A | Discharge status: Home / Self Care | Payer: No Typology Code available for payment source | Source: Ambulatory Visit | Attending: Obstetrics and Gynecology | Admitting: Obstetrics and Gynecology

## 2018-03-02 DIAGNOSIS — R921 Mammographic calcification found on diagnostic imaging of breast: Secondary | ICD-10-CM

## 2019-05-27 IMAGING — MG STEREOTACTIC CORE NEEDLE BIOPSY
8 of 10 series · 8 of 18 positions shown · non-contrast
Comparison: Previous exams.

ADDENDUM:
Pathology revealed BENIGN BREAST TISSUE WITH FIBROCYSTIC CHANGE AND
DYSTROPHIC CALCIFICATIONS of the Left breast, lower, inner quadrant.
This was found to be concordant by Dr. Jhonny Roger Bendezu.

Pathology results were discussed with the patient by telephone. The
patient reported doing well after the biopsy with tenderness at the
site. Post biopsy instructions and care were reviewed and questions
were answered. The patient was encouraged to call The [REDACTED]
The patient was instructed to return for annual screening
mammography at [HOSPITAL[facility] in [HOSPITAL[facility]HOSPITAL].
Pathology results reported by Farawla Kiro, RN on 03/04/2018.
CLINICAL DATA: Patient presents for stereotactic core needle biopsy
of left breast calcifications.
EXAM:
LEFT BREAST STEREOTACTIC CORE NEEDLE BIOPSY

[L (1 of 7)]
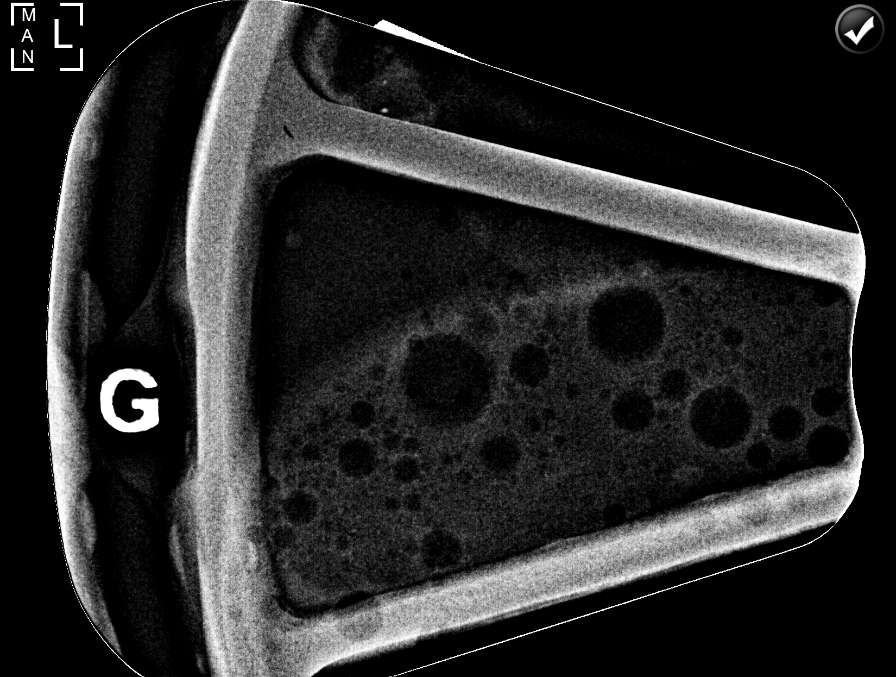

[L (2 of 7)]
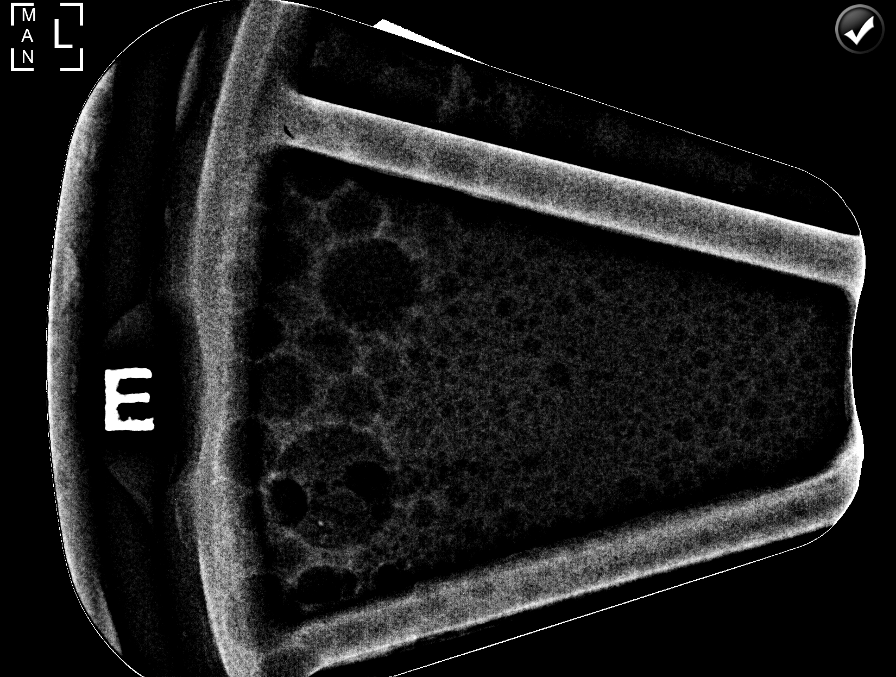

[L (3 of 7)]
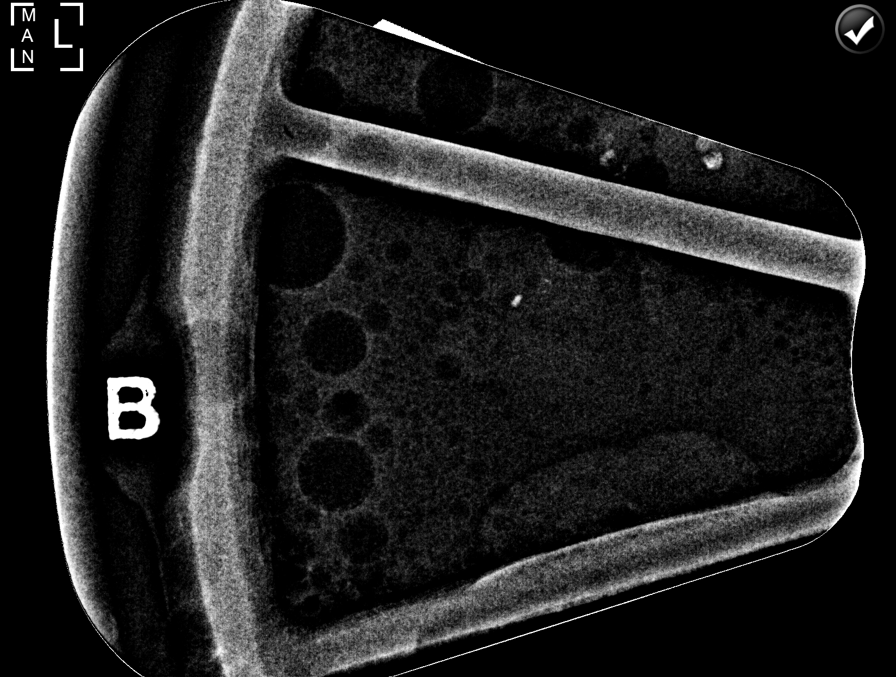

[L (4 of 7)]
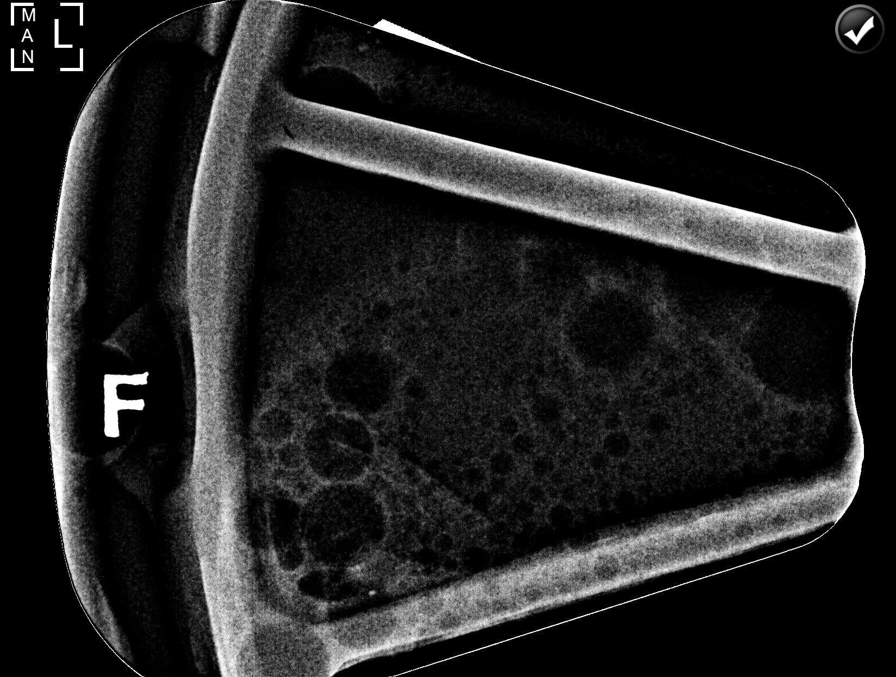

[L (5 of 7)]
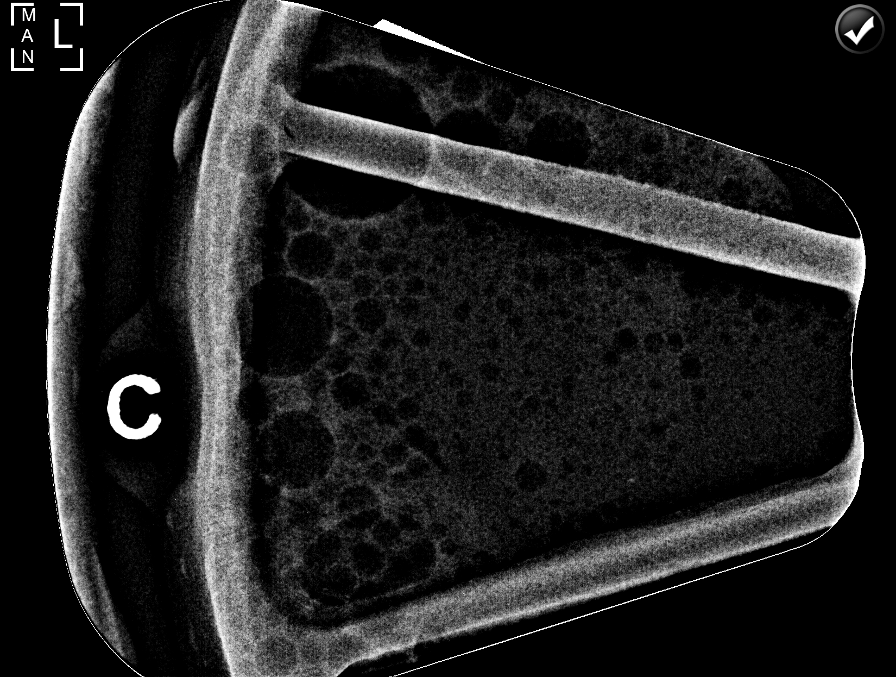

[L (6 of 7)]
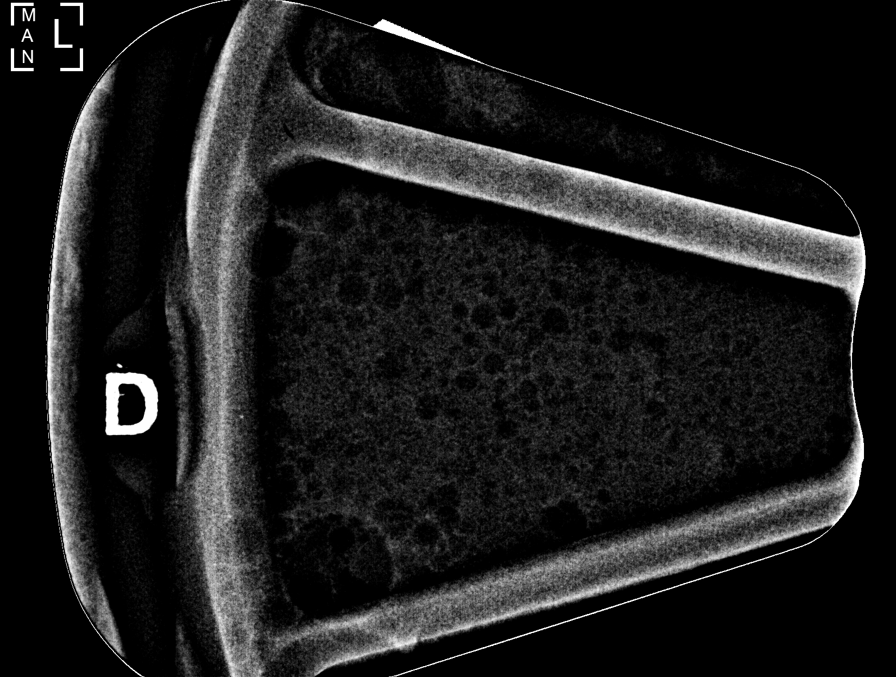

[L (7 of 7)]
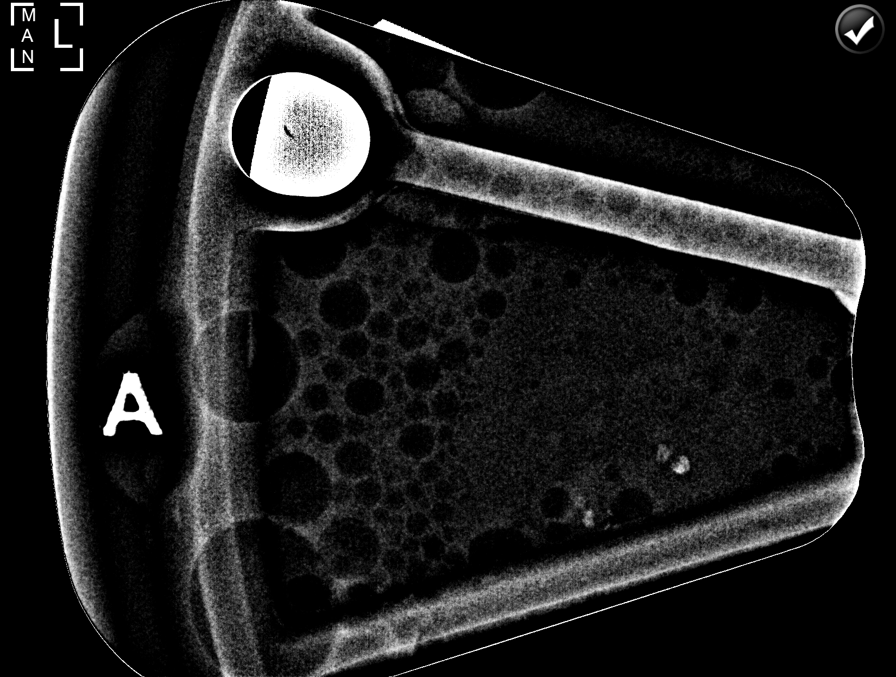

[L ML]
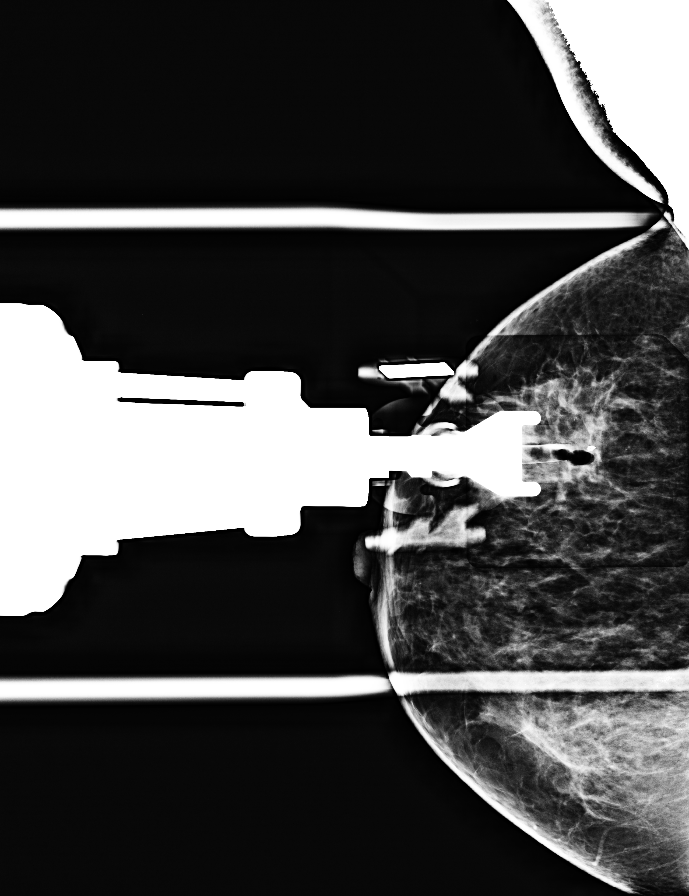

[8 of 18 positions shown; findings below may reference images not displayed]



Using sterile technique and 1% Lidocaine as local anesthetic, under
stereotactic guidance, a 9 gauge vacuum assisted device was used to
perform core needle biopsy of calcifications in the lower, inner
quadrant of the left breast using a medial approach. Specimen
radiograph was performed showing multiple calcifications for which
biopsy was performed. Specimens with calcifications are identified
for pathology.

Lesion quadrant: Lower inner quadrant

At the conclusion of the procedure, a coil shaped tissue marker clip
was deployed into the biopsy cavity. Follow-up 2-view mammogram was
performed and dictated separately.
IMPRESSION: Stereotactic-guided biopsy of left breast calcifications. No
apparent complications.

## 2020-02-28 ENCOUNTER — Other Ambulatory Visit: Payer: Self-pay | Admitting: Obstetrics and Gynecology

## 2020-02-28 DIAGNOSIS — R590 Localized enlarged lymph nodes: Secondary | ICD-10-CM

## 2020-03-27 ENCOUNTER — Other Ambulatory Visit: Payer: No Typology Code available for payment source

## 2021-03-19 NOTE — Progress Notes (Signed)
[facility] [place] [facility] Phone: [phone] Subjective:   Fontaine No, am serving as a scribe for Dr. Hulan Saas. This visit occurred during the SARS-CoV-2 public health emergency.  Safety protocols were in place, including screening questions prior to the visit, additional usage of staff PPE, and extensive cleaning of exam room while observing appropriate contact time as indicated for disinfecting solutions.   I'm seeing this patient by the request  of:  Derinda Late, MD  CC: Neck and right arm pain  LKT:[id number]  [name] Huang is a 45 y.o. female coming in with complaint of neck and right arm pain that radiates into R scapula. Constant dull pain but can be sharp in middle trap and scapula. Patient states that she has been in pain for past 3 weeks. Numbness in 2nd and 3rd fingers. History of carpal tunnel. Fell asleep with 3 pillows and thought the pain she was in would go away. Was put on prednisone which did not help her pain. Patient has tried Tramadol and this did not help her pain. Unable to sleep due to pain.        Past Medical History:  Diagnosis Date   Abnormal colonoscopy    precancerous cells removed   Asthma    Carpal tunnel syndrome    GERD (gastroesophageal reflux disease)    with pregnancy   IBS (irritable bowel syndrome)    Kidney stone    Mild hyperemesis gravidarum, antepartum    Poison ivy    PONV (postoperative nausea and vomiting)    PP care - 1C/S 5/21 09/23/2011   PUPPP (pruritic urticarial papules and plaques of pregnancy)    Past Surgical History:  Procedure Laterality Date   CESAREAN SECTION  09/22/2011   Procedure: CESAREAN SECTION;  Surgeon: Lovenia Kim, MD;  Location: [facility];  Service: Gynecology;  Laterality: N/A;   CESAREAN SECTION N/A 04/04/2013   Procedure: Repeat CESAREAN SECTION;  Surgeon: Lovenia Kim, MD;  Location: [facility];  Service: Obstetrics;  Laterality: N/A;   EDD: 04/05/13   CESAREAN SECTION N/A 06/21/2015   Procedure: Repeat CESAREAN SECTION;  Surgeon: Brien Few, MD;  Location: [facility];  Service: Obstetrics;  Laterality: N/A;  EDD: 06/28/15    COLONOSCOPY     ROOT CANAL     WISDOM TOOTH EXTRACTION     Social History   Socioeconomic History   Marital status: Married    Spouse name: Not on file   Number of children: Not on file   Years of education: Not on file   Highest education level: Not on file  Occupational History   Not on file  Tobacco Use   Smoking status: Former    Packs/day: 0.50    Years: 6.00    Pack years: 3.00    Types: Cigarettes    Quit date: 11/19/2004    Years since quitting: 16.3   Smokeless tobacco: Never  Vaping Use   Vaping Use: Never used  Substance and Sexual Activity   Alcohol use: Yes    Comment: twice monthly   Drug use: No   Sexual activity: Yes  Other Topics Concern   Not on file  Social History Narrative   Not on file   Social Determinants of Health   Financial Resource Strain: Not on file  Food Insecurity: Not on file  Transportation Needs: Not on file  Physical Activity: Not on file  Stress: Not on file  Social  Connections: Not on file   Allergies  Allergen Reactions   Amoxicillin Hives    Has patient had a PCN reaction causing immediate rash, facial/tongue/throat swelling, SOB or lightheadedness with hypotension: unknown Has patient had a PCN reaction causing severe rash involving mucus membranes or skin necrosis: unknown Has patient had a PCN reaction that required hospitalization: unknown Has patient had a PCN reaction occurring within the last 10 years: unknown If all of the above answers are "NO", then may proceed with Cephalosporin use.    Eggs Or Egg-Derived Products     GI symptoms   Other     Tree Nuts GI symptoms and bolbus feeling in throat   Codeine Hives, Swelling and Rash   Family History  Problem Relation Age of Onset   Hypertension Mother    Diabetes Mother     Peripheral vascular disease Mother    Hypertension Father    Diabetes Father    Mental illness Father        bipolar   Heart attack Paternal Grandfather    Heart disease Paternal Grandfather    Colon cancer Other    Breast cancer Maternal Aunt    Breast cancer Maternal Grandmother    Anesthesia problems Neg Hx    Hypotension Neg Hx    Malignant hyperthermia Neg Hx    Pseudochol deficiency Neg Hx         Current Outpatient Medications (Respiratory):    albuterol (PROVENTIL HFA;VENTOLIN HFA) 108 (90 BASE) MCG/ACT inhaler, Inhale 2 puffs into the lungs every 6 (six) hours as needed for wheezing or shortness of breath.    Current Outpatient Medications (Analgesics):    meloxicam (MOBIC) 15 MG tablet, Take 1 tablet (15 mg total) by mouth daily.     Current Outpatient Medications (Other):    gabapentin (NEURONTIN) 100 MG capsule, Take 2 capsules (200 mg total) by mouth at bedtime.   tiZANidine (ZANAFLEX) 2 MG tablet, Take 1 tablet (2 mg total) by mouth at bedtime.  Current Facility-Administered Medications (Other):    0.9 %  sodium chloride infusion   Reviewed prior external information including notes and imaging from  primary care provider As well as notes that were available from care everywhere and other healthcare systems.  Past medical history, social, surgical and family history all reviewed in electronic medical record.  No pertanent information unless stated regarding to the chief complaint.   Review of Systems:  No headache, visual changes, nausea, vomiting, diarrhea, constipation, dizziness, abdominal pain, skin rash, fevers, chills, night sweats, weight loss, swollen lymph nodes, body aches, joint swelling, chest pain, shortness of breath, mood changes. POSITIVE muscle aches  Objective  Blood pressure 120/78, pulse 80, height 5\' 3"  (1.6 m), weight 160 lb (72.6 kg), SpO2 98 %, unknown if currently breastfeeding.   General: No apparent distress alert and  oriented x3 mood and affect normal, dressed appropriately.  HEENT: Pupils equal, extraocular movements intact  Respiratory: Patient's speak in full sentences and does not appear short of breath  Cardiovascular: No lower extremity edema, non tender, no erythema  Gait normal with good balance and coordination.  MSK:   Neck exam shows patient does have some loss of lordosis.  Positive Spurling's with extension on the right side.  Seems to be more in the C7 and C6 distribution.  Likely no weakness of the hand noted.  Severe trigger points noted in the trapezius, rhomboid and latissimus dorsi on the right side.  Right shoulder shows no signs of  impingement.  Right wrist exam though does show a positive Tinel sign noted.  No thenar eminence wasting noted.  Limited muscular skeletal ultrasound was performed and interpreted by Hulan Saas, M  Limited musculoskeletal ultrasound of the wrist shows the patient does have significant enlargement of the median nerve underneath the retinaculum at the wrist. Impression: Moderate carpal tunnel.  After verbal consent patient was prepped with alcohol swab and with a 25-gauge half inch needle injected into 4 distinct trigger points in the rhomboid, trapezius, and 2 in the latissimus dorsi of the right parascapular region.  A total of 3 cc of 0.5% Marcaine and 1 cc of Kenalog 40 mg/mL used.  Minimal blood loss.  Band-Aid placed.  Postinjection instructions given.      Impression and Recommendations:     The above documentation has been reviewed and is accurate and complete [name], DO

## 2021-03-20 ENCOUNTER — Ambulatory Visit (INDEPENDENT_AMBULATORY_CARE_PROVIDER_SITE_OTHER): Payer: Managed Care, Other (non HMO)

## 2021-03-20 ENCOUNTER — Ambulatory Visit: Payer: Self-pay

## 2021-03-20 ENCOUNTER — Other Ambulatory Visit: Payer: Self-pay

## 2021-03-20 ENCOUNTER — Encounter: Payer: Self-pay | Admitting: Family Medicine

## 2021-03-20 ENCOUNTER — Ambulatory Visit (INDEPENDENT_AMBULATORY_CARE_PROVIDER_SITE_OTHER): Payer: Managed Care, Other (non HMO) | Admitting: Family Medicine

## 2021-03-20 VITALS — BP 120/78 | HR 80 | Ht 63.0 in | Wt 160.0 lb

## 2021-03-20 DIAGNOSIS — M5010 Cervical disc disorder with radiculopathy, unspecified cervical region: Secondary | ICD-10-CM | POA: Diagnosis not present

## 2021-03-20 DIAGNOSIS — M79601 Pain in right arm: Secondary | ICD-10-CM

## 2021-03-20 DIAGNOSIS — G5601 Carpal tunnel syndrome, right upper limb: Secondary | ICD-10-CM

## 2021-03-20 DIAGNOSIS — M25511 Pain in right shoulder: Secondary | ICD-10-CM

## 2021-03-20 MED ORDER — MELOXICAM 15 MG PO TABS: 15.0000 mg | ORAL_TABLET | Freq: Every day | ORAL | 0 refills | Status: DC

## 2021-03-20 MED ORDER — TIZANIDINE HCL 2 MG PO TABS: 2.0000 mg | ORAL_TABLET | Freq: Every day | ORAL | 0 refills | Status: DC

## 2021-03-20 MED ORDER — GABAPENTIN 100 MG PO CAPS: 200.0000 mg | ORAL_CAPSULE | Freq: Every day | ORAL | 0 refills | Status: DC

## 2021-03-20 NOTE — Assessment & Plan Note (Signed)
We discussed the anatomy involved, and that carpal tunnel syndrome primarily involves the median nerve, and this typically affects digits one through 3.  ° °We also discussed that mild cases of carpal tunnel syndrome are often improved with night splints.  If symptoms persist it is very reasonable to consider a carpal tunnel injection.  ° °If the patient does have moderate to severe carpal tunnel syndrome based on NCV, then it is certainly reasonable to consider surgical consultation for definitive management possible carpal tunnel release.  We also discussed his severe carpal tunnel syndrome can lead to permanent nerve impairment even if released. At this point, the patient like to proceed conservatively. ° °

## 2021-03-20 NOTE — Patient Instructions (Addendum)
Adjustable standing desk Gabapentin 200mg  at night Meloxicam 15mg  during the day for 10 days, then as needed Zanaflex 2mg  at bedtime Exercises Wear brace day and night for 2 weeks then at night for 2 weeks See me again in 6-8 weeks

## 2021-03-20 NOTE — Assessment & Plan Note (Signed)
Severe overall with radiation in the C6 and C7 distribution.  We discussed icing regimen and home exercises.  Patient given gabapentin, meloxicam and Zanaflex.  Attempted trigger point injections to help with some symptomatology.  X-rays are pending at this time.  Worsening pain advanced imaging would be warranted.  Patient will follow up with me again in 4 to 6 weeks and will contact us if no significant improvement.

## 2021-03-20 NOTE — Assessment & Plan Note (Signed)
Trigger point injections were given today.  Tolerated the procedure well.  Help with the leg discomfort.  Do think it is more secondary to the cervical radiculopathy.  Patient will increase activity slowly.  Follow-up again in 6 to 8 weeks.

## 2021-04-11 ENCOUNTER — Other Ambulatory Visit: Payer: Self-pay | Admitting: Family Medicine

## 2021-04-22 ENCOUNTER — Other Ambulatory Visit: Payer: Self-pay | Admitting: Family Medicine

## 2021-04-22 NOTE — Progress Notes (Signed)
[place] Phone: [phone] Subjective:   [name] Huang, am serving as a scribe for Dr. Hulan Saas. This visit occurred during the SARS-CoV-2 public health emergency.  Safety protocols were in place, including screening questions prior to the visit, additional usage of staff PPE, and extensive cleaning of exam room while observing appropriate contact time as indicated for disinfecting solutions.  I'm seeing this patient by the request  of:  Derinda Late, MD  CC: Neck pain follow-up  [id number]  03/20/2021 Trigger point injections were given today.  Tolerated the procedure well.  Help with the leg discomfort.  Do think it is more secondary to the cervical radiculopathy.  Patient will increase activity slowly.  Follow-up again in 6 to 8 weeks.  We discussed the anatomy involved, and that carpal tunnel syndrome primarily involves the median nerve, and this typically affects digits one through 3.    We also discussed that mild cases of carpal tunnel syndrome are often improved with night splints.  If symptoms persist it is very reasonable to consider a carpal tunnel injection.    If the patient does have moderate to severe carpal tunnel syndrome based on NCV, then it is certainly reasonable to consider surgical consultation for definitive management possible carpal tunnel release.  We also discussed his severe carpal tunnel syndrome can lead to permanent nerve impairment even if released. At this point, the patient like to proceed conservatively.  Severe overall with radiation in the C6 and C7 distribution.  We discussed icing regimen and home exercises.  Patient given gabapentin, meloxicam and Zanaflex.  Attempted trigger point injections to help with some symptomatology.  X-rays are pending at this time.  Worsening pain advanced imaging would be warranted.  Patient will follow up with me again in 4 to 6 weeks and  will contact us if Huang significant improvement.  Updated 04/23/2021 [name] Huang is a 45 y.o. female coming in with complaint of right arm and neck pain. Patient did have a reduction in pain with gabapentin, zanaflex, and meloxicam. She had to stop taking them as she got the flu and then pneumonia one week after seeing [place]. Notes that in past week her back and neck have started to bother her more. Took medication last night for 1st time in a while and she slept for 9 hours. Feels that pain is more manageable and that daily pain is much less. arm and hand tingling more intermittent since injection last visit.   Patient has been diagnosed with ADHD and started adderol yesterday.   Xray IMPRESSION: Mild to moderate degenerative disc disease C5-C7.       Past Medical History:  Diagnosis Date   Abnormal colonoscopy    precancerous cells removed   Asthma    Carpal tunnel syndrome    GERD (gastroesophageal reflux disease)    with pregnancy   IBS (irritable bowel syndrome)    Kidney stone    Mild hyperemesis gravidarum, antepartum    Poison ivy    PONV (postoperative nausea and vomiting)    PP care - 1C/S 5/21 09/23/2011   PUPPP (pruritic urticarial papules and plaques of pregnancy)    Past Surgical History:  Procedure Laterality Date   CESAREAN SECTION  09/22/2011   Procedure: CESAREAN SECTION;  Surgeon: Lovenia Kim, MD;  Location: [facility];  Service: Gynecology;  Laterality: N/A;   CESAREAN SECTION N/A 04/04/2013   Procedure: Repeat CESAREAN SECTION;  Surgeon:  Lovenia Kim, MD;  Location: [facility];  Service: Obstetrics;  Laterality: N/A;  EDD: 04/05/13   CESAREAN SECTION N/A 06/21/2015   Procedure: Repeat CESAREAN SECTION;  Surgeon: Brien Few, MD;  Location: [facility];  Service: Obstetrics;  Laterality: N/A;  EDD: 06/28/15    COLONOSCOPY     ROOT CANAL     WISDOM TOOTH EXTRACTION     Social History   Socioeconomic History   Marital status: Married    Spouse name: Not on  file   Number of children: Not on file   Years of education: Not on file   Highest education level: Not on file  Occupational History   Not on file  Tobacco Use   Smoking status: Former    Packs/day: 0.50    Years: 6.00    Pack years: 3.00    Types: Cigarettes    Quit date: 11/19/2004    Years since quitting: 16.4   Smokeless tobacco: Never  Vaping Use   Vaping Use: Never used  Substance and Sexual Activity   Alcohol use: Yes    Comment: twice monthly   Drug use: Huang   Sexual activity: Yes  Other Topics Concern   Not on file  Social History Narrative   Not on file   Social Determinants of Health   Financial Resource Strain: Not on file  Food Insecurity: Not on file  Transportation Needs: Not on file  Physical Activity: Not on file  Stress: Not on file  Social Connections: Not on file   Allergies  Allergen Reactions   Amoxicillin Hives    Has patient had a PCN reaction causing immediate rash, facial/tongue/throat swelling, SOB or lightheadedness with hypotension: unknown Has patient had a PCN reaction causing severe rash involving mucus membranes or skin necrosis: unknown Has patient had a PCN reaction that required hospitalization: unknown Has patient had a PCN reaction occurring within the last 10 years: unknown If all of the above answers are "Huang", then may proceed with Cephalosporin use.    Eggs Or Egg-Derived Products     GI symptoms   Other     Tree Nuts GI symptoms and bolbus feeling in throat   Codeine Hives, Swelling and Rash   Family History  Problem Relation Age of Onset   Hypertension Mother    Diabetes Mother    Peripheral vascular disease Mother    Hypertension Father    Diabetes Father    Mental illness Father        bipolar   Heart attack Paternal Grandfather    Heart disease Paternal Grandfather    Colon cancer Other    Breast cancer Maternal Aunt    Breast cancer Maternal Grandmother    Anesthesia problems Neg Hx    Hypotension Neg Hx     Malignant hyperthermia Neg Hx    Pseudochol deficiency Neg Hx         Current Outpatient Medications (Respiratory):    albuterol (PROVENTIL HFA;VENTOLIN HFA) 108 (90 BASE) MCG/ACT inhaler, Inhale 2 puffs into the lungs every 6 (six) hours as needed for wheezing or shortness of breath.    Current Outpatient Medications (Analgesics):    meloxicam (MOBIC) 15 MG tablet, Take 1 tablet (15 mg total) by mouth daily.     Current Outpatient Medications (Other):    amphetamine-dextroamphetamine (ADDERALL) 5 MG tablet, Take 5 mg by mouth daily.   gabapentin (NEURONTIN) 100 MG capsule, Take 2 capsules (200 mg total) by mouth at bedtime.  tiZANidine (ZANAFLEX) 2 MG tablet, TAKE 1 TABLET BY MOUTH EVERYDAY AT BEDTIME  Current Facility-Administered Medications (Other):    0.9 %  sodium chloride infusion   Reviewed prior external information including notes and imaging from  primary care provider As well as notes that were available from care everywhere and other healthcare systems.  Past medical history, social, surgical and family history all reviewed in electronic medical record.  Huang pertanent information unless stated regarding to the chief complaint.   Review of Systems:  Huang headache, visual changes, nausea, vomiting, diarrhea, constipation, dizziness, abdominal pain, skin rash, fevers, chills, night sweats, weight loss, swollen lymph nodes, body aches, joint swelling, chest pain, shortness of breath, mood changes. POSITIVE muscle aches  Objective  Blood pressure 140/86, pulse 99, height 5\' 3"  (1.6 m), weight 168 lb (76.2 kg), SpO2 99 %, unknown if currently breastfeeding.   General: Huang apparent distress alert and oriented x3 mood and affect normal, dressed appropriately.  HEENT: Pupils equal, extraocular movements intact  Respiratory: Patient's speak in full sentences and does not appear short of breath  Cardiovascular: Huang lower extremity edema, non tender, Huang erythema  Gait  normal with good balance and coordination.  MSK: Neck exam does have some loss of lordosis.  Some tenderness to palpation in the parascapular region right greater than left.  Patient does have some limited right-sided sidebending and rotation of the neck.  Lacks last 5 degrees of extension  Osteopathic findings C2 flexed rotated and side bent right C4 flexed rotated and side bent left C6 flexed rotated and side bent right T3 extended rotated and side bent right inhaled third rib T9 extended rotated and side bent right L2 flexed rotated and side bent right Sacrum right on right     Impression and Recommendations:     The above documentation has been reviewed and is accurate and complete [name], DO

## 2021-04-23 ENCOUNTER — Encounter: Payer: Self-pay | Admitting: Family Medicine

## 2021-04-23 ENCOUNTER — Ambulatory Visit (INDEPENDENT_AMBULATORY_CARE_PROVIDER_SITE_OTHER): Payer: Managed Care, Other (non HMO) | Admitting: Family Medicine

## 2021-04-23 ENCOUNTER — Other Ambulatory Visit: Payer: Self-pay

## 2021-04-23 ENCOUNTER — Ambulatory Visit: Payer: Self-pay

## 2021-04-23 VITALS — BP 140/86 | HR 99 | Ht 63.0 in | Wt 168.0 lb

## 2021-04-23 DIAGNOSIS — M9901 Segmental and somatic dysfunction of cervical region: Secondary | ICD-10-CM | POA: Diagnosis not present

## 2021-04-23 DIAGNOSIS — M9908 Segmental and somatic dysfunction of rib cage: Secondary | ICD-10-CM | POA: Diagnosis not present

## 2021-04-23 DIAGNOSIS — M9903 Segmental and somatic dysfunction of lumbar region: Secondary | ICD-10-CM | POA: Diagnosis not present

## 2021-04-23 DIAGNOSIS — M9904 Segmental and somatic dysfunction of sacral region: Secondary | ICD-10-CM | POA: Diagnosis not present

## 2021-04-23 DIAGNOSIS — M5010 Cervical disc disorder with radiculopathy, unspecified cervical region: Secondary | ICD-10-CM

## 2021-04-23 DIAGNOSIS — M25531 Pain in right wrist: Secondary | ICD-10-CM

## 2021-04-23 DIAGNOSIS — M9902 Segmental and somatic dysfunction of thoracic region: Secondary | ICD-10-CM | POA: Diagnosis not present

## 2021-04-23 NOTE — Patient Instructions (Signed)
Good to see you  Do exercises regularly See me again in 6 weeks

## 2021-04-24 ENCOUNTER — Encounter: Payer: Self-pay | Admitting: Family Medicine

## 2021-04-24 DIAGNOSIS — M9901 Segmental and somatic dysfunction of cervical region: Secondary | ICD-10-CM | POA: Insufficient documentation

## 2021-04-24 NOTE — Assessment & Plan Note (Signed)
° °  Decision today to treat with OMT was based on Physical Exam  After verbal consent patient was treated with HVLA, ME, FPR techniques in cervical, thoracic, rib, lumbar and sacral areas, all areas are chronic   Patient tolerated the procedure well with improvement in symptoms  Patient given exercises, stretches and lifestyle modifications  See medications in patient instructions if given  Patient will follow up in 6-8 weeks 

## 2021-04-24 NOTE — Assessment & Plan Note (Signed)
Arthritic changes noted.  Patient did respond extremely well though to osteopathic manipulation for this chronic problem with likely exacerbation.  Discussed taking the gabapentin and the Zanaflex on a more regular basis which patient has only done occasionally.  Discussed with patient about doing the home exercises more regularly.  Patient will follow up with me again in 6 to 8 weeks

## 2021-05-27 NOTE — Progress Notes (Deleted)
[place] Phone: [phone] Subjective:    I'm seeing this patient by the request  of:  Derinda Late, MD  CC:   [id number]  [name] Huang is a 46 y.o. female coming in with complaint of back and neck pain. OMT 04/23/2021 and R wrist pn f/u. Patient states   Medications patient has been prescribed: Zanaflex  Taking:         Reviewed prior external information including notes and imaging from previsou exam, outside providers and external EMR if available.   As well as notes that were available from care everywhere and other healthcare systems.  Past medical history, social, surgical and family history all reviewed in electronic medical record.  No pertanent information unless stated regarding to the chief complaint.   Past Medical History:  Diagnosis Date   Abnormal colonoscopy    precancerous cells removed   Asthma    Carpal tunnel syndrome    GERD (gastroesophageal reflux disease)    with pregnancy   IBS (irritable bowel syndrome)    Kidney stone    Mild hyperemesis gravidarum, antepartum    Poison ivy    PONV (postoperative nausea and vomiting)    PP care - 1C/S 5/21 09/23/2011   PUPPP (pruritic urticarial papules and plaques of pregnancy)     Allergies  Allergen Reactions   Amoxicillin Hives    Has patient had a PCN reaction causing immediate rash, facial/tongue/throat swelling, SOB or lightheadedness with hypotension: unknown Has patient had a PCN reaction causing severe rash involving mucus membranes or skin necrosis: unknown Has patient had a PCN reaction that required hospitalization: unknown Has patient had a PCN reaction occurring within the last 10 years: unknown If all of the above answers are "NO", then may proceed with Cephalosporin use.    Eggs Or Egg-Derived Products     GI symptoms   Other     Tree Nuts GI symptoms and bolbus feeling in throat   Codeine Hives,  Swelling and Rash     Review of Systems:  No headache, visual changes, nausea, vomiting, diarrhea, constipation, dizziness, abdominal pain, skin rash, fevers, chills, night sweats, weight loss, swollen lymph nodes, body aches, joint swelling, chest pain, shortness of breath, mood changes. POSITIVE muscle aches  Objective  unknown if currently breastfeeding.   General: No apparent distress alert and oriented x3 mood and affect normal, dressed appropriately.  HEENT: Pupils equal, extraocular movements intact  Respiratory: Patient's speak in full sentences and does not appear short of breath  Cardiovascular: No lower extremity edema, non tender, no erythema  Neuro: Cranial nerves II through XII are intact, neurovascularly intact in all extremities with 2+ DTRs and 2+ pulses.  Gait normal with good balance and coordination.  MSK:  Non tender with full range of motion and good stability and symmetric strength and tone of shoulders, elbows, wrist, hip, knee and ankles bilaterally.  Back - Normal skin, Spine with normal alignment and no deformity.  No tenderness to vertebral process palpation.  Paraspinous muscles are not tender and without spasm.   Range of motion is full at neck and lumbar sacral regions  Osteopathic findings  C2 flexed rotated and side bent right C6 flexed rotated and side bent left T3 extended rotated and side bent right inhaled rib T9 extended rotated and side bent left L2 flexed rotated and side bent right Sacrum right on right       Assessment and Plan:  Nonallopathic problems  Decision today to treat with OMT was based on Physical Exam  After verbal consent patient was treated with HVLA, ME, FPR techniques in cervical, rib, thoracic, lumbar, and sacral  areas  Patient tolerated the procedure well with improvement in symptoms  Patient given exercises, stretches and lifestyle modifications  See medications in patient instructions if given  Patient  will follow up in 4-8 weeks      The above documentation has been reviewed and is accurate and complete [name] Huang       Note: This dictation was prepared with Dragon dictation along with smaller phrase technology. Any transcriptional errors that result from this process are unintentional.

## 2021-05-28 ENCOUNTER — Ambulatory Visit: Payer: Managed Care, Other (non HMO) | Admitting: Family Medicine

## 2021-09-01 HISTORY — PX: BREAST REDUCTION SURGERY: SHX8

## 2022-01-14 NOTE — Progress Notes (Signed)
[name] [address] [name] Phone: [phone] Subjective:   IVilma Huang, am serving as a scribe for Dr. Hulan Saas.  I'm seeing this patient by the request  of:  Derinda Late, MD  CC: Neck pain, calf pain  [id number]  [name] Huang is a 46 y.o. female coming in with complaint of back and neck pain. OMT on 04/23/2021. Also seen for R wrist pain. Patient states having right calf pain. Pickleball incident 7 weeks ago, felt like she got hit in the calf with a racket. Then couldn't dorsiflex. Doing well now. Able to do elliptical 2-3 times a week. Now having left hip soreness wondering if it has to do with her gait.  Medications patient has been prescribed: Zanaflex Mobic gabapentin  Taking:         Reviewed prior external information including notes and imaging from previsou exam, outside providers and external EMR if available.   As well as notes that were available from care everywhere and other healthcare systems.  Past medical history, social, surgical and family history all reviewed in electronic medical record.  No pertanent information unless stated regarding to the chief complaint.   Past Medical History:  Diagnosis Date   Abnormal colonoscopy    precancerous cells removed   Asthma    Carpal tunnel syndrome    GERD (gastroesophageal reflux disease)    with pregnancy   IBS (irritable bowel syndrome)    Kidney stone    Mild hyperemesis gravidarum, antepartum    Poison ivy    PONV (postoperative nausea and vomiting)    PP care - 1C/S 5/21 09/23/2011   PUPPP (pruritic urticarial papules and plaques of pregnancy)     Allergies  Allergen Reactions   Amoxicillin Hives    Has patient had a PCN reaction causing immediate rash, facial/tongue/throat swelling, SOB or lightheadedness with hypotension: unknown Has patient had a PCN reaction causing severe rash involving mucus membranes or skin necrosis:  unknown Has patient had a PCN reaction that required hospitalization: unknown Has patient had a PCN reaction occurring within the last 10 years: unknown If all of the above answers are "NO", then may proceed with Cephalosporin use.    Eggs Or Egg-Derived Products     GI symptoms   Other     Tree Nuts GI symptoms and bolbus feeling in throat   Codeine Hives, Swelling and Rash     Review of Systems:  No headache, visual changes, nausea, vomiting, diarrhea, constipation, dizziness, abdominal pain, skin rash, fevers, chills, night sweats, weight loss, swollen lymph nodes, body aches, joint swelling, chest pain, shortness of breath, mood changes. POSITIVE muscle aches  Objective  Blood pressure 116/80, pulse 81, height '5\' 3"'$  (1.6 m), weight 167 lb (75.8 kg), SpO2 98 %, unknown if currently breastfeeding.   General: No apparent distress alert and oriented x3 mood and affect normal, dressed appropriately.  HEENT: Pupils equal, extraocular movements intact  Respiratory: Patient's speak in full sentences and does not appear short of breath  Cardiovascular: No lower extremity edema, non tender, no erythema  Gait mildly antalgic MSK:  Back does have tightness noted in the parascapular area.  Does have tightness in the neck right greater than left.  Negative Spurling's of the neck.  Patient's low back and tightness noted around the right sacroiliac joint with a positive FABER test.  Right calf has fullness compared to the contralateral side.  Negative Thompson test.  Osteopathic findings  C4 flexed rotated and side bent right C5 flexed rotated and side bent left T3 extended rotated and side bent right inhaled rib T8 extended rotated and side bent left L2 flexed rotated and side bent right Sacrum right on right   Limited muscular skeletal ultrasound was performed and interpreted by Hulan Saas, M  Limited ultrasound of patient's right calf shows the patient does have a hypoechoic  changes in the gastrocnemius head of the medial compartment but shows the patient does have what appears to be a seroma.  Large tear that is greater than 2 inches in length.  Only approximately half inch in depth. Impression: Gastrocnemius tear    Assessment and Plan:  Cervical disc disorder with radiculopathy of cervical region Discussed HEP  Discussed which activities to do and which ones to avoid  RTC in 6  Gastrocnemius tear, right, initial encounter Tear of the medial gastroc head.  Patient does have seroma noted of the medial gastroc head.  No significant retraction of the origin or the insertion.  Patient was given a heel lift, discussed compression, in 3 weeks and start increasing activity.  Discussed nonweightbearing exercises, swimming and biking.  Follow-up again in 6 weeks    Nonallopathic problems  Decision today to treat with OMT was based on Physical Exam  After verbal consent patient was treated with HVLA, ME, FPR techniques in cervical, rib, thoracic, lumbar, and sacral  areas  Patient tolerated the procedure well with improvement in symptoms  Patient given exercises, stretches and lifestyle modifications  See medications in patient instructions if given  Patient will follow up in 4-8 weeks     The above documentation has been reviewed and is accurate and complete [name], DO         Note: This dictation was prepared with Dragon dictation along with smaller phrase technology. Any transcriptional errors that result from this process are unintentional.

## 2022-01-15 ENCOUNTER — Ambulatory Visit (INDEPENDENT_AMBULATORY_CARE_PROVIDER_SITE_OTHER): Payer: Managed Care, Other (non HMO) | Admitting: Family Medicine

## 2022-01-15 ENCOUNTER — Ambulatory Visit: Payer: Self-pay

## 2022-01-15 VITALS — BP 116/80 | HR 81 | Ht 63.0 in | Wt 167.0 lb

## 2022-01-15 DIAGNOSIS — M9903 Segmental and somatic dysfunction of lumbar region: Secondary | ICD-10-CM | POA: Diagnosis not present

## 2022-01-15 DIAGNOSIS — M5010 Cervical disc disorder with radiculopathy, unspecified cervical region: Secondary | ICD-10-CM | POA: Diagnosis not present

## 2022-01-15 DIAGNOSIS — M9908 Segmental and somatic dysfunction of rib cage: Secondary | ICD-10-CM | POA: Diagnosis not present

## 2022-01-15 DIAGNOSIS — M9901 Segmental and somatic dysfunction of cervical region: Secondary | ICD-10-CM

## 2022-01-15 DIAGNOSIS — M9902 Segmental and somatic dysfunction of thoracic region: Secondary | ICD-10-CM

## 2022-01-15 DIAGNOSIS — S86111A Strain of other muscle(s) and tendon(s) of posterior muscle group at lower leg level, right leg, initial encounter: Secondary | ICD-10-CM | POA: Diagnosis not present

## 2022-01-15 DIAGNOSIS — M79661 Pain in right lower leg: Secondary | ICD-10-CM | POA: Diagnosis not present

## 2022-01-15 DIAGNOSIS — M9904 Segmental and somatic dysfunction of sacral region: Secondary | ICD-10-CM

## 2022-01-15 NOTE — Patient Instructions (Signed)
Compression sleeve for calf Heel lifts Do prescribed exercises at least 3x a week  Recovery sandals ([place] or Oofos)

## 2022-01-15 NOTE — Assessment & Plan Note (Signed)
Discussed HEP  Discussed which activities to do and which ones to avoid  RTC in 6

## 2022-01-15 NOTE — Assessment & Plan Note (Signed)
Tear of the medial gastroc head.  Patient does have seroma noted of the medial gastroc head.  No significant retraction of the origin or the insertion.  Patient was given a heel lift, discussed compression, in 3 weeks and start increasing activity.  Discussed nonweightbearing exercises, swimming and biking.  Follow-up again in 6 weeks

## 2022-02-02 ENCOUNTER — Encounter: Payer: Self-pay | Admitting: Gastroenterology

## 2022-02-23 NOTE — Progress Notes (Unsigned)
[place] Phone: [phone] Subjective:    I'm seeing this patient by the request  of:  Derinda Late, MD  CC: Back pain and neck pain  [id number]  [name] Huang is a 46 y.o. female coming in with complaint of back and neck pain. OMT on 01/15/2022. Also f/u on calf tear. Patient states doing well. Here for manipulation. No other issues at this time.  Medications patient has been prescribed: None  Taking:         Reviewed prior external information including notes and imaging from previsou exam, outside providers and external EMR if available.   As well as notes that were available from care everywhere and other healthcare systems.  Past medical history, social, surgical and family history all reviewed in electronic medical record.  No pertanent information unless stated regarding to the chief complaint.   Past Medical History:  Diagnosis Date   Abnormal colonoscopy    precancerous cells removed   Asthma    Carpal tunnel syndrome    GERD (gastroesophageal reflux disease)    with pregnancy   IBS (irritable bowel syndrome)    Kidney stone    Mild hyperemesis gravidarum, antepartum    Poison ivy    PONV (postoperative nausea and vomiting)    PP care - 1C/S 5/21 09/23/2011   PUPPP (pruritic urticarial papules and plaques of pregnancy)     Allergies  Allergen Reactions   Amoxicillin Hives    Has patient had a PCN reaction causing immediate rash, facial/tongue/throat swelling, SOB or lightheadedness with hypotension: unknown Has patient had a PCN reaction causing severe rash involving mucus membranes or skin necrosis: unknown Has patient had a PCN reaction that required hospitalization: unknown Has patient had a PCN reaction occurring within the last 10 years: unknown If all of the above answers are "NO", then may proceed with Cephalosporin use.    Eggs Or Egg-Derived Products     GI  symptoms   Other     Tree Nuts GI symptoms and bolbus feeling in throat   Codeine Hives, Swelling and Rash     Review of Systems:  No headache, visual changes, nausea, vomiting, diarrhea, constipation, dizziness, abdominal pain, skin rash, fevers, chills, night sweats, weight loss, swollen lymph nodes, body aches, joint swelling, chest pain, shortness of breath, mood changes. POSITIVE muscle aches  Objective  Blood pressure 108/78, pulse 78, height '5\' 3"'$  (1.6 m), weight 169 lb (76.7 kg), SpO2 98 %, unknown if currently breastfeeding.   General: No apparent distress alert and oriented x3 mood and affect normal, dressed appropriately.  HEENT: Pupils equal, extraocular movements intact  Respiratory: Patient's speak in full sentences and does not appear short of breath  Cardiovascular: No lower extremity edema, non tender, no erythema  Tightness noted in the right parascapular region.  Patient does have some tenderness to palpation noted.  Some limited sidebending.  Osteopathic findings  C2 flexed rotated and side bent right C6 flexed rotated and side bent left T3 extended rotated and side bent right inhaled rib T9 extended rotated and side bent left L2 flexed rotated and side bent right Sacrum right on right       Assessment and Plan:  Cervical disc disorder with radiculopathy of cervical region Degenerative disc disease.  Discussed icing regimen and home exercises, which activities to do and which ones to avoid.  Increase activity slowly over the course the next several weeks.  Follow-up again  in 6 to 8 weeks.    Nonallopathic problems  Decision today to treat with OMT was based on Physical Exam  After verbal consent patient was treated with HVLA, ME, FPR techniques in cervical, rib, thoracic, lumbar, and sacral  areas  Patient tolerated the procedure well with improvement in symptoms  Patient given exercises, stretches and lifestyle modifications  See medications in  patient instructions if given  Patient will follow up in 4-8 weeks     The above documentation has been reviewed and is accurate and complete [name], DO         Note: This dictation was prepared with Dragon dictation along with smaller phrase technology. Any transcriptional errors that result from this process are unintentional.

## 2022-02-26 ENCOUNTER — Ambulatory Visit (INDEPENDENT_AMBULATORY_CARE_PROVIDER_SITE_OTHER): Payer: Managed Care, Other (non HMO) | Admitting: Family Medicine

## 2022-02-26 VITALS — BP 108/78 | HR 78 | Ht 63.0 in | Wt 169.0 lb

## 2022-02-26 DIAGNOSIS — M5010 Cervical disc disorder with radiculopathy, unspecified cervical region: Secondary | ICD-10-CM | POA: Diagnosis not present

## 2022-02-26 DIAGNOSIS — M9904 Segmental and somatic dysfunction of sacral region: Secondary | ICD-10-CM | POA: Diagnosis not present

## 2022-02-26 DIAGNOSIS — M9903 Segmental and somatic dysfunction of lumbar region: Secondary | ICD-10-CM | POA: Diagnosis not present

## 2022-02-26 DIAGNOSIS — M9902 Segmental and somatic dysfunction of thoracic region: Secondary | ICD-10-CM

## 2022-02-26 DIAGNOSIS — M9908 Segmental and somatic dysfunction of rib cage: Secondary | ICD-10-CM | POA: Diagnosis not present

## 2022-02-26 DIAGNOSIS — M9901 Segmental and somatic dysfunction of cervical region: Secondary | ICD-10-CM | POA: Diagnosis not present

## 2022-02-26 NOTE — Assessment & Plan Note (Signed)
Degenerative disc disease.  Discussed icing regimen and home exercises, which activities to do and which ones to avoid.  Increase activity slowly over the course the next several weeks.  Follow-up again in 6 to 8 weeks.

## 2022-02-26 NOTE — Patient Instructions (Addendum)
Good to see you!  See you again in 7-8 weeks 

## 2022-04-15 ENCOUNTER — Ambulatory Visit (AMBULATORY_SURGERY_CENTER): Payer: Self-pay | Admitting: *Deleted

## 2022-04-15 ENCOUNTER — Other Ambulatory Visit: Payer: Self-pay

## 2022-04-15 VITALS — Ht 63.0 in | Wt 155.0 lb

## 2022-04-15 DIAGNOSIS — Z86010 Personal history of colonic polyps: Secondary | ICD-10-CM

## 2022-04-15 MED ORDER — NA SULFATE-K SULFATE-MG SULF 17.5-3.13-1.6 GM/177ML PO SOLN: 1.0000 | Freq: Once | ORAL | 0 refills | Status: AC

## 2022-04-15 NOTE — Progress Notes (Signed)
Pre visit completed over telephone and instructions forwarded through [facility].   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 No issues known to pt with past sedation with any surgeries or procedures Patient denies ever being told they had issues or difficulty with intubation  No FH of Malignant Hyperthermia Pt is not on diet pills Pt is not on  home 02  Pt is not on blood thinners  Pt denies issues with constipation  No A fib or A flutter  Pt instructed to use [email or web address] or GoodRx for a price reduction on prep

## 2022-04-22 ENCOUNTER — Emergency Department (HOSPITAL_BASED_OUTPATIENT_CLINIC_OR_DEPARTMENT_OTHER): Payer: Managed Care, Other (non HMO)

## 2022-04-22 ENCOUNTER — Encounter (HOSPITAL_BASED_OUTPATIENT_CLINIC_OR_DEPARTMENT_OTHER): Payer: Self-pay

## 2022-04-22 ENCOUNTER — Other Ambulatory Visit: Payer: Self-pay

## 2022-04-22 ENCOUNTER — Emergency Department (HOSPITAL_BASED_OUTPATIENT_CLINIC_OR_DEPARTMENT_OTHER)
Admission: EM | Admit: 2022-04-22 | Discharge: 2022-04-22 | Disposition: A | Discharge status: Home / Self Care | Payer: Managed Care, Other (non HMO) | Attending: Emergency Medicine | Admitting: Emergency Medicine

## 2022-04-22 DIAGNOSIS — N200 Calculus of kidney: Secondary | ICD-10-CM | POA: Insufficient documentation

## 2022-04-22 DIAGNOSIS — R1030 Lower abdominal pain, unspecified: Secondary | ICD-10-CM | POA: Diagnosis present

## 2022-04-22 LAB — BASIC METABOLIC PANEL
Anion gap: 9 (ref 5–15)
BUN: 14 mg/dL (ref 6–20)
CO2: 24 mmol/L (ref 22–32)
Calcium: 9.1 mg/dL (ref 8.9–10.3)
Chloride: 107 mmol/L (ref 98–111)
Creatinine, Ser: 1.04 mg/dL — ABNORMAL HIGH (ref 0.44–1.00)
GFR, Estimated: >60 mL/min (ref >60)
Glucose, Bld: 95 mg/dL (ref 70–99)
Potassium: 5.7 mmol/L — ABNORMAL HIGH (ref 3.5–5.1)
Sodium: 140 mmol/L (ref 135–145)

## 2022-04-22 LAB — URINALYSIS, ROUTINE W REFLEX MICROSCOPIC
Bilirubin Urine: NEGATIVE
Glucose, UA: NEGATIVE mg/dL
Ketones, ur: NEGATIVE mg/dL
Leukocytes,Ua: NEGATIVE
Nitrite: NEGATIVE
RBC / HPF: >50 RBC/hpf — ABNORMAL HIGH (ref 0–5)
Specific Gravity, Urine: 1.033 — ABNORMAL HIGH (ref 1.005–1.030)
pH: 7 (ref 5.0–8.0)

## 2022-04-22 LAB — CBC
HCT: 40.1 % (ref 36.0–46.0)
Hemoglobin: 13.4 g/dL (ref 12.0–15.0)
MCH: 29.1 pg (ref 26.0–34.0)
MCHC: 33.4 g/dL (ref 30.0–36.0)
MCV: 87.2 fL (ref 80.0–100.0)
Platelets: 318 10*3/uL (ref 150–400)
RBC: 4.6 MIL/uL (ref 3.87–5.11)
RDW: 13.5 % (ref 11.5–15.5)
WBC: 8.5 10*3/uL (ref 4.0–10.5)
nRBC: 0 % (ref 0.0–0.2)

## 2022-04-22 LAB — PREGNANCY, URINE: Preg Test, Ur: NEGATIVE

## 2022-04-22 MED ORDER — TAMSULOSIN HCL 0.4 MG PO CAPS
0.4000 mg | ORAL_CAPSULE | Freq: Once | ORAL | Status: AC
  Administered 2022-04-22: 0.4 mg via ORAL
  Filled 2022-04-22: qty 1

## 2022-04-22 MED ORDER — ONDANSETRON HCL 4 MG/2ML IJ SOLN
4.0000 mg | Freq: Once | INTRAMUSCULAR | Status: AC
  Administered 2022-04-22: 4 mg via INTRAVENOUS
  Filled 2022-04-22: qty 2

## 2022-04-22 MED ORDER — SODIUM CHLORIDE 0.9 % IV BOLUS
1000.0000 mL | Freq: Once | INTRAVENOUS | Status: AC
  Administered 2022-04-22: 1000 mL via INTRAVENOUS

## 2022-04-22 MED ORDER — MORPHINE SULFATE (PF) 4 MG/ML IV SOLN
4.0000 mg | Freq: Once | INTRAVENOUS | Status: AC
  Administered 2022-04-22: 4 mg via INTRAVENOUS
  Filled 2022-04-22: qty 1

## 2022-04-22 MED ORDER — ONDANSETRON 4 MG PO TBDP: 4.0000 mg | ORAL_TABLET | ORAL | 0 refills | Status: DC | PRN

## 2022-04-22 MED ORDER — TAMSULOSIN HCL 0.4 MG PO CAPS: 0.4000 mg | ORAL_CAPSULE | Freq: Every day | ORAL | 0 refills | Status: DC

## 2022-04-22 MED ORDER — KETOROLAC TROMETHAMINE 30 MG/ML IJ SOLN
30.0000 mg | Freq: Once | INTRAMUSCULAR | Status: AC
  Administered 2022-04-22: 30 mg via INTRAVENOUS
  Filled 2022-04-22: qty 1

## 2022-04-22 MED ORDER — OXYCODONE-ACETAMINOPHEN 5-325 MG PO TABS: 1.0000 | ORAL_TABLET | Freq: Four times a day (QID) | ORAL | 0 refills | Status: DC | PRN

## 2022-04-22 MED ORDER — HYDROMORPHONE HCL 1 MG/ML IJ SOLN
0.5000 mg | Freq: Once | INTRAMUSCULAR | Status: AC
  Administered 2022-04-22: 0.5 mg via INTRAVENOUS
  Filled 2022-04-22: qty 1

## 2022-04-22 MED ORDER — IBUPROFEN 400 MG PO TABS: 400.0000 mg | ORAL_TABLET | Freq: Three times a day (TID) | ORAL | 0 refills | Status: DC | PRN

## 2022-04-22 NOTE — ED Notes (Signed)
We had been unable to locate pt. For a time, however she was found to be in our lobby.

## 2022-04-22 NOTE — ED Notes (Signed)
Urine strainer given to pt.

## 2022-04-22 NOTE — ED Provider Notes (Signed)
[facility] EMERGENCY DEPT Provider Note   CSN: [id number] Arrival date & time: 04/22/22  1118     History  Chief Complaint  Patient presents with   Flank Pain    [name] Huang is a 46 y.o. female.  HPI Patient reports she had sudden onset of pain during a work meeting this morning.  She had right flank pain severe and radiating to the lower abdomen.  Patient has had a kidney stone 13 years ago she reports it felt very similar.  She was seen in urgent care and plain film x-rays identified calcifications.  She continued to be in severe pain and was referred to emergency department for further evaluation.  She reports she does feel somewhat nauseated but not had active vomiting.  She had a little bit of pelvic discomfort a couple days ago before onset of symptoms.  She has not had any persistent or ongoing burning urgency with urination.  She has not noted blood in the urine.  She thought she had a yeast infection and took some medication for that.  She has been well.  No nausea no vomiting no diarrhea, no respiratory illness.  At baseline she does not take any medications.    Home Medications Prior to Admission medications   Medication Sig Start Date End Date Taking? Authorizing Provider  ibuprofen (ADVIL) 400 MG tablet Take 1 tablet (400 mg total) by mouth every 8 (eight) hours as needed. 04/22/22  Yes Charlesetta Shanks, MD  ondansetron (ZOFRAN-ODT) 4 MG disintegrating tablet Take 1 tablet (4 mg total) by mouth every 4 (four) hours as needed for nausea or vomiting. 04/22/22  Yes Elfego Giammarino, Jeannie Done, MD  oxyCODONE-acetaminophen (PERCOCET) 5-325 MG tablet Take 1 tablet by mouth every 6 (six) hours as needed. 04/22/22  Yes Charlesetta Shanks, MD  tamsulosin (FLOMAX) 0.4 MG CAPS capsule Take 1 capsule (0.4 mg total) by mouth daily. 04/22/22  Yes Charlesetta Shanks, MD  albuterol (PROVENTIL HFA;VENTOLIN HFA) 108 (90 BASE) MCG/ACT inhaler Inhale 2 puffs into the lungs every 6 (six) hours  as needed for wheezing or shortness of breath.     [provider]  amphetamine-dextroamphetamine (ADDERALL) 10 MG tablet Take 10 mg by mouth 2 (two) times daily.    [provider]  Ashwagandha 500 MG CAPS See admin instructions.    [provider]  Cholecalciferol 50 MCG (2000 UT) CAPS 1 capsule    [provider]  gabapentin (NEURONTIN) 100 MG capsule Take 2 capsules (200 mg total) by mouth at bedtime. 03/20/21   [name], DO  MAGNESIUM GLUCONATE PO See admin instructions.    [provider]  meloxicam (MOBIC) 15 MG tablet Take 1 tablet (15 mg total) by mouth daily. 03/20/21   [name], DO  tiZANidine (ZANAFLEX) 2 MG tablet TAKE 1 TABLET BY MOUTH EVERYDAY AT BEDTIME 04/23/21   [name], DO      Allergies    Amoxicillin, Eggs or egg-derived products, Other, and Codeine    Review of Systems   Review of Systems  Physical Exam Updated Vital Signs BP 130/82   Pulse 64   Temp 97.9 F (36.6 C) (Oral)   Resp 18   Ht '5\' 3"'$  (1.6 m)   Wt 70.3 kg   SpO2 100%   BMI 27.45 kg/m  Physical Exam Constitutional:      Comments: Alert nontoxic clinically well in appearance.  Patient is in pain.  No respiratory distress.  Well-nourished well-developed.  HENT:  Mouth/Throat:     Pharynx: Oropharynx is clear.  Eyes:     Extraocular Movements: Extraocular movements intact.  Cardiovascular:     Rate and Rhythm: Normal rate and regular rhythm.  Pulmonary:     Effort: Pulmonary effort is normal.     Breath sounds: Normal breath sounds.  Abdominal:     Comments: Abdomen soft.  Mild right lateral quadrant tenderness palpation.  No guarding.  No significant suprapubic pain to palpation.  Musculoskeletal:        General: No swelling or tenderness. Normal range of motion.     Right lower leg: No edema.     Left lower leg: No edema.  Skin:    General: Skin is warm and dry.  Neurological:     General: No focal deficit present.      Mental Status: She is oriented to person, place, and time.     Coordination: Coordination normal.  Psychiatric:        Mood and Affect: Mood normal.     ED Results / Procedures / Treatments   Labs (all labs ordered are listed, but only abnormal results are displayed) Labs Reviewed  URINALYSIS, ROUTINE W REFLEX MICROSCOPIC - Abnormal; Notable for the following components:      Result Value   APPearance CLOUDY (*)    Specific Gravity, Urine 1.033 (*)    Hgb urine dipstick MODERATE (*)    Protein, ur TRACE (*)    RBC / HPF >50 (*)    All other components within normal limits  BASIC METABOLIC PANEL - Abnormal; Notable for the following components:   Potassium 5.7 (*)    Creatinine, Ser 1.04 (*)    All other components within normal limits  PREGNANCY, URINE  CBC    EKG None  Radiology CT Renal Stone Study  Result Date: 04/22/2022 CLINICAL DATA:  Abdominal and flank pain.  History of kidney stones. EXAM: CT ABDOMEN AND PELVIS WITHOUT CONTRAST TECHNIQUE: Multidetector CT imaging of the abdomen and pelvis was performed following the standard protocol without IV contrast. RADIATION DOSE REDUCTION: This exam was performed according to the departmental dose-optimization program which includes automated exposure control, adjustment of the mA and/or kV according to patient size and/or use of iterative reconstruction technique. COMPARISON:  07/03/2010 FINDINGS: Lower chest: Unremarkable. Hepatobiliary: No suspicious focal abnormality in the liver on this study without intravenous contrast. There is no evidence for gallstones, gallbladder wall thickening, or pericholecystic fluid. No intrahepatic or extrahepatic biliary dilation. Pancreas: No focal mass lesion. No dilatation of the main duct. No intraparenchymal cyst. No peripancreatic edema. Spleen: No splenomegaly. No focal mass lesion. Adrenals/Urinary Tract: No adrenal nodule or mass. A cluster of 4 nonobstructing stones is identified in  the lower pole the right kidney (see coronal image 47 of series 5 in bone windows). Largest of the 4 stones measures 8 x 7 mm. Smallest of the 4 measures 3-4 mm. There is mild to moderate right hydroureteronephrosis secondary to the presence of a 2 x 3 x 4 mm stone in the distal right ureter (axial image 74/2), 5-10 mm proximal to the UVJ. No stones are seen in the left kidney or ureter. No secondary changes in the left kidney or ureter. No bladder stones. Stomach/Bowel: Stomach is unremarkable. No gastric wall thickening. No evidence of outlet obstruction. Duodenum is normally positioned as is the ligament of Treitz. No small bowel wall thickening. No small bowel dilatation. The terminal ileum is normal. The appendix is normal. No gross  colonic mass. No colonic wall thickening. Vascular/Lymphatic: No abdominal aortic aneurysm. No abdominal lymphadenopathy. No pelvic sidewall lymphadenopathy. Reproductive: The uterus is unremarkable. Dominant follicle noted left ovary. No followup imaging is recommended. No right adnexal mass. Other: No intraperitoneal free fluid. Musculoskeletal: No worrisome lytic or sclerotic osseous abnormality. IMPRESSION: 1. Mild to moderate right hydroureteronephrosis secondary to the presence of a 2 x 3 x 4 mm distal right ureteral stone about 5-10 mm proximal to the UVJ. 2. Cluster of 4 nonobstructing stones in the lower pole the right kidney. Electronically Signed   By: Misty Stanley M.D.   On: 04/22/2022 15:56    Procedures Procedures    Medications Ordered in ED Medications  sodium chloride 0.9 % bolus 1,000 mL (0 mLs Intravenous Stopped 04/22/22 1928)  morphine (PF) 4 MG/ML injection 4 mg (4 mg Intravenous Given 04/22/22 1522)  ketorolac (TORADOL) 30 MG/ML injection 30 mg (30 mg Intravenous Given 04/22/22 1522)  ondansetron (ZOFRAN) injection 4 mg (4 mg Intravenous Given 04/22/22 1522)  HYDROmorphone (DILAUDID) injection 0.5 mg (0.5 mg Intravenous Given 04/22/22 1823)   tamsulosin (FLOMAX) capsule 0.4 mg (0.4 mg Oral Given 04/22/22 1822)  sodium chloride 0.9 % bolus 1,000 mL (1,000 mLs Intravenous New Bag/Given 04/22/22 1933)    ED Course/ Medical Decision Making/ A&P                           Medical Decision Making Amount and/or Complexity of Data Reviewed Labs: ordered.  Risk Prescription drug management.   Patient has known history of kidney stones.  She had acute onset of pain today consistent with prior kidney stones.  Patient was seen in urgent care and plain film showed stones in the right upper quadrant.  Differential diagnosis includes UTI\pyelonephritis\kidney stone\colitis or gastroenteritis\pelvic infection.  Will start pain control with Toradol and morphine.  Will proceed with CT scan to identify stone location.  With Toradol and morphine patient had pain improvement and was able to rest but still rates pain at a 6 out of 10, will add Dilaudid 0.5 mg.  CT scan returned and reviewed by radiology positive for a 4 mm ureteral stone on the right.  There are stable stones in the right renal pelvis.  Urinalysis greater than 50 blood 6-10 white cells.  No leukocytosis.  At this time patient does not have signs of UTI.  Upon reassessment patient is very comfortable.  At this point she does feel ready to go home and take medications with plan for follow-up with urology.  We reviewed pain management at home.  We reviewed return precautions.        Final Clinical Impression(s) / ED Diagnoses Final diagnoses:  Kidney stone    Rx / DC Orders ED Discharge Orders          Ordered    oxyCODONE-acetaminophen (PERCOCET) 5-325 MG tablet  Every 6 hours PRN        04/22/22 1942    ibuprofen (ADVIL) 400 MG tablet  Every 8 hours PRN        04/22/22 1942    tamsulosin (FLOMAX) 0.4 MG CAPS capsule  Daily        04/22/22 1942    ondansetron (ZOFRAN-ODT) 4 MG disintegrating tablet  Every 4 hours PRN        04/22/22 1942               Charlesetta Shanks, MD 04/22/22 1947

## 2022-04-22 NOTE — Discharge Instructions (Signed)
1.  Stay well-hydrated.  Take Flomax daily.  Use ibuprofen 400 mg every 6-8 hours for baseline pain control.  Take 1-2 Percocet every 6 hours as needed for additional pain control.  Take Zofran if you have nausea. 2.  Schedule a follow-up appointment with [facility] within the next 1 to 2 weeks. 3.  Return to the emergency department if you get a fever, your pain cannot be controlled at home with your medications or other concerning symptoms.

## 2022-04-22 NOTE — ED Triage Notes (Signed)
Patient here POV from Home.  Endorses Right Flank Pain that began at 0920 this AM. Went to UC and had XR Imaging completed which demonstrated Renal Stones. Sent for Evaluation.   Uncomfortable during Triage. A&Ox4. GCS 15. Ambulatory.

## 2022-04-22 NOTE — ED Notes (Signed)
Continuing IV bolus prior to d/c

## 2022-04-23 ENCOUNTER — Encounter: Payer: Self-pay | Admitting: Gastroenterology

## 2022-04-23 ENCOUNTER — Telehealth: Payer: Self-pay | Admitting: Gastroenterology

## 2022-04-23 NOTE — Telephone Encounter (Signed)
Spoke with patient, she stated that she was not in pain today, and stated that she had a cup strainer to help pass the kidney stone. Spoke with MD, and advised patient that if she was not in extreme pain today and could tolerate the prep we would proceed with her colonoscopy.  Ultimately patient decided to reschedule because she was just informed that her daughter has the flu. Patient was rescheduled to 05/11/22 at 3:30pm. New instructions were sent to patient via mychart and patient has [facility] numbers to call if she has any concerns before the procedure.

## 2022-04-23 NOTE — Telephone Encounter (Signed)
Inbound call from patient stating she was in the ED yesterday due to having a kidney stone.  Patient states she is on pain meds and is inquiring if she should come in for procedure,  Please advise.

## 2022-04-24 ENCOUNTER — Encounter: Payer: Managed Care, Other (non HMO) | Admitting: Gastroenterology

## 2022-05-11 ENCOUNTER — Ambulatory Visit (AMBULATORY_SURGERY_CENTER): Payer: Managed Care, Other (non HMO) | Admitting: Gastroenterology

## 2022-05-11 ENCOUNTER — Encounter: Payer: Self-pay | Admitting: Gastroenterology

## 2022-05-11 VITALS — BP 119/76 | HR 69 | Temp 97.4°F | Resp 18 | Ht 62.0 in | Wt 169.0 lb

## 2022-05-11 DIAGNOSIS — D128 Benign neoplasm of rectum: Secondary | ICD-10-CM

## 2022-05-11 DIAGNOSIS — Z09 Encounter for follow-up examination after completed treatment for conditions other than malignant neoplasm: Secondary | ICD-10-CM

## 2022-05-11 DIAGNOSIS — Z86010 Personal history of colonic polyps: Secondary | ICD-10-CM

## 2022-05-11 DIAGNOSIS — K621 Rectal polyp: Secondary | ICD-10-CM

## 2022-05-11 MED ORDER — SODIUM CHLORIDE 0.9 % IV SOLN: 500.0000 mL | Freq: Once | INTRAVENOUS | Status: DC

## 2022-05-11 NOTE — Patient Instructions (Signed)
Resume all of your regular medications today.  Read all of the handouts given to you by your recovery room nurse.  YOU HAD AN ENDOSCOPIC PROCEDURE TODAY AT [facility]:   Refer to the procedure report that was given to you for any specific questions about what was found during the examination.  If the procedure report does not answer your questions, please call your gastroenterologist to clarify.  If you requested that your care partner not be given the details of your procedure findings, then the procedure report has been included in a sealed envelope for you to review at your convenience later.  YOU SHOULD EXPECT: Some feelings of bloating in the abdomen. Passage of more gas than usual.  Walking can help get rid of the air that was put into your GI tract during the procedure and reduce the bloating. If you had a lower endoscopy (such as a colonoscopy or flexible sigmoidoscopy) you may notice spotting of blood in your stool or on the toilet paper. If you underwent a bowel prep for your procedure, you may not have a normal bowel movement for a few days.  Please Note:  You might notice some irritation and congestion in your nose or some drainage.  This is from the oxygen used during your procedure.  There is no need for concern and it should clear up in a day or so.  SYMPTOMS TO REPORT IMMEDIATELY:  Following lower endoscopy (colonoscopy or flexible sigmoidoscopy):  Excessive amounts of blood in the stool  Significant tenderness or worsening of abdominal pains  Swelling of the abdomen that is new, acute  Fever of 100F or higher      For urgent or emergent issues, a gastroenterologist can be reached at any hour by calling [phone]. Do not use MyChart messaging for urgent concerns.    DIET:  We do recommend a small meal at first, but then you may proceed to your regular diet.  Drink plenty of fluids but you should avoid alcoholic beverages for 24 hours.  ACTIVITY:  You  should plan to take it easy for the rest of today and you should NOT DRIVE or use heavy machinery until tomorrow (because of the sedation medicines used during the test).    FOLLOW UP: Our staff will call the number listed on your records the next business day following your procedure.  We will call around 7:15- 8:00 am to check on you and address any questions or concerns that you may have regarding the information given to you following your procedure. If we do not reach you, we will leave a message.     If any biopsies were taken you will be contacted by phone or by letter within the next 1-3 weeks.  Please call us at [phone] if you have not heard about the biopsies in 3 weeks.    SIGNATURES/CONFIDENTIALITY: You and/or your care partner have signed paperwork which will be entered into your electronic medical record.  These signatures attest to the fact that that the information above on your After Visit Summary has been reviewed and is understood.  Full responsibility of the confidentiality of this discharge information lies with you and/or your care-partner.

## 2022-05-11 NOTE — Progress Notes (Unsigned)
PT had a kidney stone after her PV and was hospitalized.

## 2022-05-11 NOTE — Op Note (Signed)
[facility] Patient Name: [name] Huang Procedure Date: 05/11/2022 3:36 PM MRN: [id number] Endoscopist: Mauri Pole , MD, [id number] Age: 47 Referring MD:  Date of Birth: 1975-08-02 Gender: Female Account #: [id number] Procedure:                Colonoscopy Indications:              High risk colon cancer surveillance: Personal                            history of colonic polyps. Index colonopy with                            polyp removal at age 49 and then last exam in 2018                            with adenomatous polyp <1 cm Medicines:                Monitored Anesthesia Care Procedure:                Pre-Anesthesia Assessment:                           - Prior to the procedure, a History and Physical                            was performed, and patient medications and                            allergies were reviewed. The patient's tolerance of                            previous anesthesia was also reviewed. The risks                            and benefits of the procedure and the sedation                            options and risks were discussed with the patient.                            All questions were answered, and informed consent                            was obtained. Prior Anticoagulants: The patient has                            taken no anticoagulant or antiplatelet agents. ASA                            Grade Assessment: II - A patient with mild systemic                            disease. After reviewing the risks and benefits,  the patient was deemed in satisfactory condition to                            undergo the procedure.                           After obtaining informed consent, the colonoscope                            was passed under direct vision. Throughout the                            procedure, the patient's blood pressure, pulse, and                            oxygen saturations were  monitored continuously. The                            Olympus [id number]-[id number] (#[id number]) Colonoscope was                            introduced through the anus and advanced to the the                            cecum, identified by appendiceal orifice and                            ileocecal valve. The colonoscopy was performed                            without difficulty. The patient tolerated the                            procedure well. The quality of the bowel                            preparation was excellent. The ileocecal valve,                            appendiceal orifice, and rectum were photographed. Scope In: 3:53:33 PM Scope Out: 4:11:46 PM Scope Withdrawal Time: 0 hours 11 minutes 25 seconds  Total Procedure Duration: 0 hours 18 minutes 13 seconds  Findings:                 The perianal and digital rectal examinations were                            normal.                           A 1 mm polyp was found in the rectum. The polyp was                            sessile. The polyp was removed with a cold biopsy  forceps. Resection and retrieval were complete.                           Non-bleeding internal hemorrhoids were found during                            retroflexion. The hemorrhoids were small.                           The exam was otherwise without abnormality. Complications:            No immediate complications. Estimated Blood Loss:     Estimated blood loss was minimal. Impression:               - One 1 mm polyp in the rectum, removed with a cold                            biopsy forceps. Resected and retrieved.                           - Non-bleeding internal hemorrhoids.                           - The examination was otherwise normal. Recommendation:           - Patient has a contact number available for                            emergencies. The signs and symptoms of potential                            delayed complications were  discussed with the                            patient. Return to normal activities tomorrow.                            Written discharge instructions were provided to the                            patient.                           - Resume previous diet.                           - Continue present medications.                           - Await pathology results.                           - Repeat colonoscopy in 5 years for surveillance                            based on pathology results. Mauri Pole, MD 05/11/2022 4:17:52 PM This report has been signed electronically.

## 2022-05-11 NOTE — Progress Notes (Unsigned)
[facility] History and Physical   Primary Care Physician:  Derinda Late, MD   Reason for Procedure:  History of adenomatous colon polyps  Plan:    Surveillance colonoscopy with possible interventions as needed     HPI: [name] Huang is a very pleasant 47 y.o. female here for surveillance colonoscopy. Denies any nausea, vomiting, abdominal pain, melena or bright red blood per rectum  The risks and benefits as well as alternatives of endoscopic procedure(s) have been discussed and reviewed. All questions answered. The patient agrees to proceed.    Past Medical History:  Diagnosis Date   Abnormal colonoscopy    precancerous cells removed   Anemia    Asthma    Carpal tunnel syndrome    IBS (irritable bowel syndrome)    Kidney stone    Mild hyperemesis gravidarum, antepartum    Poison ivy    PONV (postoperative nausea and vomiting)    PP care - 1C/S 5/21 09/23/2011   PUPPP (pruritic urticarial papules and plaques of pregnancy)     Past Surgical History:  Procedure Laterality Date   CESAREAN SECTION  09/22/2011   Procedure: CESAREAN SECTION;  Surgeon: Lovenia Kim, MD;  Location: [facility];  Service: Gynecology;  Laterality: N/A;   CESAREAN SECTION N/A 04/04/2013   Procedure: Repeat CESAREAN SECTION;  Surgeon: Lovenia Kim, MD;  Location: [facility];  Service: Obstetrics;  Laterality: N/A;  EDD: 04/05/13   CESAREAN SECTION N/A 06/21/2015   Procedure: Repeat CESAREAN SECTION;  Surgeon: Brien Few, MD;  Location: [facility];  Service: Obstetrics;  Laterality: N/A;  EDD: 06/28/15    COLONOSCOPY     ROOT CANAL     WISDOM TOOTH EXTRACTION      Prior to Admission medications   Medication Sig Start Date End Date Taking? Authorizing Provider  amphetamine-dextroamphetamine (ADDERALL) 10 MG tablet Take 10 mg by mouth 2 (two) times daily.   Yes [provider]  albuterol (PROVENTIL HFA;VENTOLIN HFA) 108 (90 BASE) MCG/ACT inhaler Inhale 2 puffs into the  lungs every 6 (six) hours as needed for wheezing or shortness of breath.     [provider]  Ashwagandha 500 MG CAPS See admin instructions.    [provider]  Cholecalciferol 50 MCG (2000 UT) CAPS 1 capsule    [provider]  ibuprofen (ADVIL) 400 MG tablet Take 1 tablet (400 mg total) by mouth every 8 (eight) hours as needed. 04/22/22   Charlesetta Shanks, MD  MAGNESIUM GLUCONATE PO See admin instructions.    [provider]    Current Outpatient Medications  Medication Sig Dispense Refill   amphetamine-dextroamphetamine (ADDERALL) 10 MG tablet Take 10 mg by mouth 2 (two) times daily.     albuterol (PROVENTIL HFA;VENTOLIN HFA) 108 (90 BASE) MCG/ACT inhaler Inhale 2 puffs into the lungs every 6 (six) hours as needed for wheezing or shortness of breath.      Ashwagandha 500 MG CAPS See admin instructions.     Cholecalciferol 50 MCG (2000 UT) CAPS 1 capsule     ibuprofen (ADVIL) 400 MG tablet Take 1 tablet (400 mg total) by mouth every 8 (eight) hours as needed. 30 tablet 0   MAGNESIUM GLUCONATE PO See admin instructions.     Current Facility-Administered Medications  Medication Dose Route Frequency Provider Last Rate Last Admin   0.9 %  sodium chloride infusion  500 mL Intravenous Continuous Manju Kulkarni V, MD       0.9 %  sodium chloride infusion  500 mL Intravenous Once Mauri Pole, MD        Allergies as of 05/11/2022 - Review Complete 05/11/2022  Allergen Reaction Noted   Amoxicillin Hives 06/20/2011   Eggs or egg-derived products  12/14/2016   Other  12/14/2016   Codeine Hives, Swelling, and Rash 11/20/2011    Family History  Problem Relation Age of Onset   Hypertension Mother    Diabetes Mother    Peripheral vascular disease Mother    Hypertension Father    Diabetes Father    Mental illness Father        bipolar   Heart attack Paternal Grandfather    Heart disease Paternal Grandfather    Colon cancer Other    Breast  cancer Maternal Aunt    Breast cancer Maternal Grandmother    Anesthesia problems Neg Hx    Hypotension Neg Hx    Malignant hyperthermia Neg Hx    Pseudochol deficiency Neg Hx     Social History   Socioeconomic History   Marital status: Divorced    Spouse name: Not on file   Number of children: Not on file   Years of education: Not on file   Highest education level: Not on file  Occupational History   Not on file  Tobacco Use   Smoking status: Former    Packs/day: 0.50    Years: 6.00    Total pack years: 3.00    Types: Cigarettes    Quit date: 11/19/2004    Years since quitting: 17.4   Smokeless tobacco: Never  Vaping Use   Vaping Use: Never used  Substance and Sexual Activity   Alcohol use: Yes    Comment: twice monthly   Drug use: No   Sexual activity: Yes  Other Topics Concern   Not on file  Social History Narrative   Not on file   Social Determinants of Health   Financial Resource Strain: Not on file  Food Insecurity: Not on file  Transportation Needs: Not on file  Physical Activity: Not on file  Stress: Not on file  Social Connections: Not on file  Intimate Partner Violence: Not on file    Review of Systems:  All other review of systems negative except as mentioned in the HPI.  Physical Exam: Vital signs in last 24 hours: Blood Pressure (Abnormal) 142/97   Pulse 78   Temperature (Abnormal) 97.4 F (36.3 C)   Height '5\' 2"'$  (1.575 m)   Weight 169 lb (76.7 kg)   Last Menstrual Period 05/10/2022 (Exact Date)   Oxygen Saturation 100%   Body Mass Index 30.91 kg/m  General:   Alert, NAD Lungs:  Clear .   Heart:  Regular rate and rhythm Abdomen:  Soft, nontender and nondistended. Neuro/Psych:  Alert and cooperative. Normal mood and affect. A and O x 3  Reviewed labs, radiology imaging, old records and pertinent past GI work up  Patient is appropriate for planned procedure(s) and anesthesia in an ambulatory setting   K. Denzil Magnuson ,  MD [phone]

## 2022-05-11 NOTE — Progress Notes (Unsigned)
Called to room to assist during endoscopic procedure.  Patient ID and intended procedure confirmed with present staff. Received instructions for my participation in the procedure from the performing physician.  

## 2022-05-11 NOTE — Progress Notes (Unsigned)
Report given to PACU, vss 

## 2022-05-12 ENCOUNTER — Telehealth: Payer: Self-pay

## 2022-05-12 ENCOUNTER — Other Ambulatory Visit: Payer: Self-pay | Admitting: Urology

## 2022-05-12 ENCOUNTER — Encounter: Payer: Self-pay | Admitting: Gastroenterology

## 2022-05-12 ENCOUNTER — Encounter (HOSPITAL_COMMUNITY): Payer: Self-pay | Admitting: Urology

## 2022-05-12 NOTE — Progress Notes (Signed)
For Anesthesia: PCP - [facility] Cardiologist - N/A  Chest x-ray - N/A EKG - N/A Stress Test - N/A ECHO - N/A Cardiac Cath - N/A Pacemaker/ICD device last checked: N/A Pacemaker orders received: N/A Device Rep notified: N/A  Spinal Cord Stimulator: N/A  Sleep Study - N/A CPAP - N/A  Fasting Blood Sugar - N/A Checks Blood Sugar __N/A___ times a day Date and result of last Hgb A1c-N/A  Last dose of GLP1 agonist- N/A GLP1 instructions: N/A  Last dose of SGLT-2 inhibitors- N/A SGLT-2 instructions:N/A  Blood Thinner Instructions:N/A Aspirin Instructions:N/A Last Dose:N/A  Activity level:   Able to exercise without chest pain and/or shortness of breath     Anesthesia review: N/A  Patient denies shortness of breath, fever, cough and chest pain at PAT appointment   Patient verbalized understanding of instructions reviewed via telephone.

## 2022-05-12 NOTE — Telephone Encounter (Signed)
Left message on follow up call. 

## 2022-05-13 ENCOUNTER — Encounter (HOSPITAL_COMMUNITY): Payer: Self-pay | Admitting: Urology

## 2022-05-13 ENCOUNTER — Ambulatory Visit (HOSPITAL_COMMUNITY): Payer: Managed Care, Other (non HMO) | Admitting: Anesthesiology

## 2022-05-13 ENCOUNTER — Ambulatory Visit (HOSPITAL_COMMUNITY): Payer: Managed Care, Other (non HMO)

## 2022-05-13 ENCOUNTER — Ambulatory Visit (HOSPITAL_BASED_OUTPATIENT_CLINIC_OR_DEPARTMENT_OTHER): Payer: Managed Care, Other (non HMO) | Admitting: Anesthesiology

## 2022-05-13 ENCOUNTER — Ambulatory Visit (HOSPITAL_COMMUNITY)
Admission: RE | Admit: 2022-05-13 | Discharge: 2022-05-13 | Disposition: A | Discharge status: Home / Self Care | Payer: Managed Care, Other (non HMO) | Attending: Urology | Admitting: Urology

## 2022-05-13 ENCOUNTER — Encounter (HOSPITAL_COMMUNITY)
Admission: RE | Disposition: A | Discharge status: Home / Self Care | Payer: Self-pay | Source: Home / Self Care | Attending: Urology

## 2022-05-13 DIAGNOSIS — Z8759 Personal history of other complications of pregnancy, childbirth and the puerperium: Secondary | ICD-10-CM | POA: Diagnosis not present

## 2022-05-13 DIAGNOSIS — Z6829 Body mass index (BMI) 29.0-29.9, adult: Secondary | ICD-10-CM | POA: Diagnosis not present

## 2022-05-13 DIAGNOSIS — K589 Irritable bowel syndrome without diarrhea: Secondary | ICD-10-CM | POA: Insufficient documentation

## 2022-05-13 DIAGNOSIS — N132 Hydronephrosis with renal and ureteral calculous obstruction: Secondary | ICD-10-CM | POA: Insufficient documentation

## 2022-05-13 DIAGNOSIS — J45909 Unspecified asthma, uncomplicated: Secondary | ICD-10-CM | POA: Diagnosis not present

## 2022-05-13 DIAGNOSIS — Z01818 Encounter for other preprocedural examination: Secondary | ICD-10-CM

## 2022-05-13 DIAGNOSIS — K219 Gastro-esophageal reflux disease without esophagitis: Secondary | ICD-10-CM | POA: Insufficient documentation

## 2022-05-13 DIAGNOSIS — Z87891 Personal history of nicotine dependence: Secondary | ICD-10-CM | POA: Insufficient documentation

## 2022-05-13 HISTORY — PX: HOLMIUM LASER APPLICATION: SHX5852

## 2022-05-13 HISTORY — PX: CYSTOSCOPY WITH RETROGRADE PYELOGRAM, URETEROSCOPY AND STENT PLACEMENT: SHX5789

## 2022-05-13 SURGERY — CYSTOURETEROSCOPY, WITH RETROGRADE PYELOGRAM AND STENT INSERTION
Anesthesia: General | Laterality: Right

## 2022-05-13 MED ORDER — DEXAMETHASONE SODIUM PHOSPHATE 10 MG/ML IJ SOLN
INTRAMUSCULAR | Status: DC | PRN
  Administered 2022-05-13: 8 mg via INTRAVENOUS

## 2022-05-13 MED ORDER — ONDANSETRON HCL 4 MG/2ML IJ SOLN
INTRAMUSCULAR | Status: DC | PRN
  Administered 2022-05-13: 4 mg via INTRAVENOUS

## 2022-05-13 MED ORDER — DEXAMETHASONE SODIUM PHOSPHATE 10 MG/ML IJ SOLN
INTRAMUSCULAR | Status: AC
  Filled 2022-05-13: qty 1

## 2022-05-13 MED ORDER — ACETAMINOPHEN 500 MG PO TABS
1000.0000 mg | ORAL_TABLET | Freq: Once | ORAL | Status: AC
  Administered 2022-05-13: 1000 mg via ORAL
  Filled 2022-05-13: qty 2

## 2022-05-13 MED ORDER — CHLORHEXIDINE GLUCONATE 0.12 % MT SOLN
15.0000 mL | Freq: Once | OROMUCOSAL | Status: AC
  Administered 2022-05-13: 15 mL via OROMUCOSAL

## 2022-05-13 MED ORDER — FENTANYL CITRATE (PF) 100 MCG/2ML IJ SOLN
INTRAMUSCULAR | Status: AC
  Filled 2022-05-13: qty 2

## 2022-05-13 MED ORDER — OXYCODONE HCL 5 MG PO TABS: 5.0000 mg | ORAL_TABLET | Freq: Once | ORAL | Status: DC | PRN

## 2022-05-13 MED ORDER — LIDOCAINE HCL (CARDIAC) PF 100 MG/5ML IV SOSY
PREFILLED_SYRINGE | INTRAVENOUS | Status: DC | PRN
  Administered 2022-05-13: 100 mg via INTRAVENOUS

## 2022-05-13 MED ORDER — SCOPOLAMINE 1 MG/3DAYS TD PT72
1.0000 | MEDICATED_PATCH | TRANSDERMAL | Status: DC
  Administered 2022-05-13: 1.5 mg via TRANSDERMAL
  Filled 2022-05-13: qty 1

## 2022-05-13 MED ORDER — KETOROLAC TROMETHAMINE 30 MG/ML IJ SOLN
INTRAMUSCULAR | Status: DC | PRN
  Administered 2022-05-13: 30 mg via INTRAVENOUS

## 2022-05-13 MED ORDER — KETOROLAC TROMETHAMINE 30 MG/ML IJ SOLN
INTRAMUSCULAR | Status: AC
  Filled 2022-05-13: qty 1

## 2022-05-13 MED ORDER — FENTANYL CITRATE PF 50 MCG/ML IJ SOSY: 25.0000 ug | PREFILLED_SYRINGE | INTRAMUSCULAR | Status: DC | PRN

## 2022-05-13 MED ORDER — PROPOFOL 10 MG/ML IV BOLUS
INTRAVENOUS | Status: DC | PRN
  Administered 2022-05-13: 20 mg via INTRAVENOUS
  Administered 2022-05-13: 30 mg via INTRAVENOUS
  Administered 2022-05-13: 150 mg via INTRAVENOUS

## 2022-05-13 MED ORDER — MIDAZOLAM HCL 5 MG/5ML IJ SOLN
INTRAMUSCULAR | Status: DC | PRN
  Administered 2022-05-13: 2 mg via INTRAVENOUS

## 2022-05-13 MED ORDER — PROPOFOL 10 MG/ML IV BOLUS
INTRAVENOUS | Status: AC
  Filled 2022-05-13: qty 20

## 2022-05-13 MED ORDER — IOHEXOL 300 MG/ML  SOLN
INTRAMUSCULAR | Status: DC | PRN
  Administered 2022-05-13: 16 mL via URETHRAL

## 2022-05-13 MED ORDER — OXYCODONE HCL 5 MG/5ML PO SOLN: 5.0000 mg | Freq: Once | ORAL | Status: DC | PRN

## 2022-05-13 MED ORDER — ONDANSETRON HCL 4 MG/2ML IJ SOLN
INTRAMUSCULAR | Status: AC
  Filled 2022-05-13: qty 2

## 2022-05-13 MED ORDER — SODIUM CHLORIDE 0.9 % IR SOLN
Status: DC | PRN
  Administered 2022-05-13: 1000 mL
  Administered 2022-05-13: 6000 mL via INTRAVESICAL

## 2022-05-13 MED ORDER — FENTANYL CITRATE (PF) 100 MCG/2ML IJ SOLN
INTRAMUSCULAR | Status: DC | PRN
  Administered 2022-05-13: 100 ug via INTRAVENOUS

## 2022-05-13 MED ORDER — ACETAMINOPHEN 160 MG/5ML PO SOLN: 325.0000 mg | ORAL | Status: DC | PRN

## 2022-05-13 MED ORDER — LIDOCAINE HCL (PF) 2 % IJ SOLN
INTRAMUSCULAR | Status: AC
  Filled 2022-05-13: qty 5

## 2022-05-13 MED ORDER — MIDAZOLAM HCL 2 MG/2ML IJ SOLN
INTRAMUSCULAR | Status: AC
  Filled 2022-05-13: qty 2

## 2022-05-13 MED ORDER — ACETAMINOPHEN 325 MG PO TABS: 325.0000 mg | ORAL_TABLET | ORAL | Status: DC | PRN

## 2022-05-13 MED ORDER — GENTAMICIN SULFATE 40 MG/ML IJ SOLN
380.0000 mg | INTRAVENOUS | Status: AC
  Administered 2022-05-13: 380 mg via INTRAVENOUS
  Filled 2022-05-13: qty 9.5

## 2022-05-13 MED ORDER — LACTATED RINGERS IV SOLN: INTRAVENOUS | Status: DC

## 2022-05-13 MED ORDER — ONDANSETRON HCL 4 MG/2ML IJ SOLN: 4.0000 mg | Freq: Once | INTRAMUSCULAR | Status: DC | PRN

## 2022-05-13 MED ORDER — MEPERIDINE HCL 50 MG/ML IJ SOLN: 6.2500 mg | INTRAMUSCULAR | Status: DC | PRN

## 2022-05-13 MED ORDER — ORAL CARE MOUTH RINSE: 15.0000 mL | Freq: Once | OROMUCOSAL | Status: AC

## 2022-05-13 SURGICAL SUPPLY — 26 items
BAG URO CATCHER STRL LF (MISCELLANEOUS) ×2 IMPLANT
BASKET LASER NITINOL 1.9FR (BASKET) IMPLANT
BSKT STON RTRVL 120 1.9FR (BASKET) ×1
CATH URETL OPEN END 6FR 70 (CATHETERS) ×2 IMPLANT
CLOTH BEACON ORANGE TIMEOUT ST (SAFETY) ×2 IMPLANT
EXTRACTOR STONE 1.7FRX115CM (UROLOGICAL SUPPLIES) IMPLANT
FIBER LASER MOSES 200 DFL (Laser) IMPLANT
GLOVE SURG LX STRL 7.5 STRW (GLOVE) ×2 IMPLANT
GOWN STRL REUS W/ TWL XL LVL3 (GOWN DISPOSABLE) ×2 IMPLANT
GOWN STRL REUS W/TWL XL LVL3 (GOWN DISPOSABLE) ×1
GUIDEWIRE ANG ZIPWIRE 038X150 (WIRE) ×2 IMPLANT
GUIDEWIRE STR DUAL SENSOR (WIRE) ×2 IMPLANT
KIT TURNOVER KIT A (KITS) IMPLANT
LASER FIB FLEXIVA PULSE ID 365 (Laser) IMPLANT
LASER FIB FLEXIVA PULSE ID 550 (Laser) IMPLANT
LASER FIB FLEXIVA PULSE ID 910 (Laser) IMPLANT
MANIFOLD NEPTUNE II (INSTRUMENTS) ×2 IMPLANT
PACK CYSTO (CUSTOM PROCEDURE TRAY) ×2 IMPLANT
SHEATH NAVIGATOR HD 11/13X28 (SHEATH) IMPLANT
SHEATH NAVIGATOR HD 11/13X36 (SHEATH) IMPLANT
STENT POLARIS 5FRX24 (STENTS) IMPLANT
TRACTIP FLEXIVA PULS ID 200XHI (Laser) IMPLANT
TRACTIP FLEXIVA PULSE ID 200 (Laser)
TUBE PU 8FR 16IN ENFIT (TUBING) ×2 IMPLANT
TUBING CONNECTING 10 (TUBING) ×2 IMPLANT
TUBING UROLOGY SET (TUBING) ×2 IMPLANT

## 2022-05-13 NOTE — Discharge Instructions (Signed)
1 - You may have urinary urgency (bladder spasms) and bloody urine on / off with stent in place. This is normal. ° °2 - Call MD or go to ER for fever >102, severe pain / nausea / vomiting not relieved by medications, or acute change in medical status ° °

## 2022-05-13 NOTE — Transfer of Care (Signed)
Immediate Anesthesia Transfer of Care Note  Patient: Malley Hauter  Procedure(s) Performed: CYSTOSCOPY WITH RETROGRADE PYELOGRAM, URETEROSCOPY AND STENT PLACEMENT; FIRST STAGE (Right) HOLMIUM LASER APPLICATION (Right)  Patient Location: PACU  Anesthesia Type:General  Level of Consciousness: awake, alert , oriented, and patient cooperative  Airway & Oxygen Therapy: Patient Spontanous Breathing and Patient connected to face mask oxygen  Post-op Assessment: Report given to RN, Post -op Vital signs reviewed and stable, and Patient moving all extremities X 4  Post vital signs: Reviewed and stable  Last Vitals:  Vitals Value Taken Time  BP 114/90 05/13/22 1639  Temp    Pulse 103 05/13/22 1640  Resp 14 05/13/22 1641  SpO2 100 % 05/13/22 1640  Vitals shown include unvalidated device data.  Last Pain:  Vitals:   05/13/22 1444  TempSrc:   PainSc: 0-No pain         Complications: No notable events documented.

## 2022-05-13 NOTE — Anesthesia Preprocedure Evaluation (Addendum)
Anesthesia Evaluation  Patient identified by MRN, date of birth, ID band Patient awake    Reviewed: Allergy & Precautions, NPO status , Patient's Chart, lab work & pertinent test results  History of Anesthesia Complications (+) PONV and history of anesthetic complications  Airway Mallampati: II  TM Distance: >3 FB Neck ROM: Full    Dental no notable dental hx. (+) Dental Advisory Given   Pulmonary asthma , former smoker   Pulmonary exam normal breath sounds clear to auscultation       Cardiovascular negative cardio ROS Normal cardiovascular exam Rhythm:Regular Rate:Normal     Neuro/Psych  Neuromuscular disease negative neurological ROS  negative psych ROS   GI/Hepatic Neg liver ROS,GERD  Medicated and Controlled,,  Endo/Other    Morbid obesity  Renal/GU Renal diseasenegative Renal ROS  negative genitourinary   Musculoskeletal negative musculoskeletal ROS (+)    Abdominal   Peds negative pediatric ROS (+)  Hematology negative hematology ROS (+) Blood dyscrasia, anemia   Anesthesia Other Findings   Reproductive/Obstetrics                             Anesthesia Physical Anesthesia Plan  ASA: 2  Anesthesia Plan: General   Post-op Pain Management: Tylenol PO (pre-op)*   Induction: Intravenous  PONV Risk Score and Plan: 4 or greater and Ondansetron, Dexamethasone, Scopolamine patch - Pre-op and Treatment may vary due to age or medical condition  Airway Management Planned: LMA  Additional Equipment: None  Intra-op Plan:   Post-operative Plan: Extubation in OR  Informed Consent: I have reviewed the patients History and Physical, chart, labs and discussed the procedure including the risks, benefits and alternatives for the proposed anesthesia with the patient or authorized representative who has indicated his/her understanding and acceptance.     Dental advisory given  Plan  Discussed with: CRNA and Anesthesiologist  Anesthesia Plan Comments:         Anesthesia Quick Evaluation

## 2022-05-13 NOTE — Anesthesia Procedure Notes (Signed)
Procedure Name: LMA Insertion Date/Time: 05/13/2022 3:14 PM  Performed by: Jonna Munro, CRNAPre-anesthesia Checklist: Patient identified, Emergency Drugs available, Suction available, Patient being monitored and Timeout performed Patient Re-evaluated:Patient Re-evaluated prior to induction Oxygen Delivery Method: Circle system utilized Preoxygenation: Pre-oxygenation with 100% oxygen Induction Type: IV induction LMA: LMA inserted LMA Size: 4.0 Placement Confirmation: positive ETCO2, CO2 detector and breath sounds checked- equal and bilateral Tube secured with: Tape Dental Injury: Teeth and Oropharynx as per pre-operative assessment

## 2022-05-13 NOTE — Anesthesia Postprocedure Evaluation (Signed)
Anesthesia Post Note  Patient: Oakley Orban  Procedure(s) Performed: CYSTOSCOPY WITH RETROGRADE PYELOGRAM, URETEROSCOPY AND STENT PLACEMENT; FIRST STAGE (Right) HOLMIUM LASER APPLICATION (Right)     Patient location during evaluation: PACU Anesthesia Type: General Level of consciousness: awake and alert Pain management: pain level controlled Vital Signs Assessment: post-procedure vital signs reviewed and stable Respiratory status: spontaneous breathing, nonlabored ventilation, respiratory function stable and patient connected to nasal cannula oxygen Cardiovascular status: blood pressure returned to baseline and stable Postop Assessment: no apparent nausea or vomiting Anesthetic complications: no   No notable events documented.  Last Vitals:  Vitals:   05/13/22 1322  BP: 138/89  Pulse: 75  Resp: 18  Temp: 37.1 C  SpO2: 99%    Last Pain:  Vitals:   05/13/22 1444  TempSrc:   PainSc: 0-No pain                 Tommy Minichiello

## 2022-05-13 NOTE — H&P (Signed)
[name] Huang is an 47 y.o. female.    Chief Complaint: Pre-Op RIGHT Ureteroscopic Stone Manipulation  HPI:   1 Recurrent Urolithiasis -  2012 - MET x 1 after miscarriage  04/2022 - 12m Rt distal stone with mod hydro + 165mRt renal/parenchymal stone by ER CT   PMH sig for mild obesity, IBS, Asthma, breast reduction / abdominoplasty. She works remote for [employer] [facility]CP [facility]  Today "[name] Huang seen to proceed with RIGHT ureteroscopic stone manipulation. UA without infectious parameters. Cr 1.04. No interval fevers.    Past Medical History:  Diagnosis Date   Abnormal colonoscopy    precancerous cells removed   Anemia    Asthma    Carpal tunnel syndrome    IBS (irritable bowel syndrome)    Kidney stone    Mild hyperemesis gravidarum, antepartum    Poison ivy    PP care - 1C/S 5/21 09/23/2011   PUPPP (pruritic urticarial papules and plaques of pregnancy)     Past Surgical History:  Procedure Laterality Date   ABDOMINOPLASTY     BREAST REDUCTION SURGERY  09/2021   CESAREAN SECTION  09/22/2011   Procedure: CESAREAN SECTION;  Surgeon: RiLovenia KimMD;  [facility];  Service: Gynecology;  Laterality: N/A;   CESAREAN SECTION N/A 04/04/2013   Procedure: Repeat CESAREAN SECTION;  Surgeon: RiLovenia KimMD;  [facility];  Service: Obstetrics;  Laterality: N/A;  EDD: 04/05/13   CESAREAN SECTION N/A 06/21/2015   Procedure: Repeat CESAREAN SECTION;  Surgeon: RiBrien FewMD;  [facility];  Service: Obstetrics;  Laterality: N/A;  EDD: 06/28/15    COLONOSCOPY     ROOT CANAL     WISDOM TOOTH EXTRACTION      Family History  Problem Relation Age of Onset   Hypertension Mother    Diabetes Mother    Peripheral vascular disease Mother    Hypertension Father    Diabetes Father    Mental illness Father        bipolar   Heart attack Paternal Grandfather    Heart disease Paternal Grandfather     Colon cancer Other    Breast cancer Maternal Aunt    Breast cancer Maternal Grandmother    Anesthesia problems Neg Hx    Hypotension Neg Hx    Malignant hyperthermia Neg Hx    Pseudochol deficiency Neg Hx    Social History:  reports that she quit smoking about 17 years ago. Her smoking use included cigarettes. She has a 3.00 pack-year smoking history. She has never used smokeless tobacco. She reports current alcohol use. She reports that she does not use drugs.  Allergies:  Allergies  Allergen Reactions   Amoxicillin Hives    Has patient had a PCN reaction causing immediate rash, facial/tongue/throat swelling, SOB or lightheadedness with hypotension: unknown Has patient had a PCN reaction causing severe rash involving mucus membranes or skin necrosis: unknown Has patient had a PCN reaction that required hospitalization: unknown Has patient had a PCN reaction occurring within the last 10 years: unknown If all of the above answers are "NO", then may proceed with Cephalosporin use.    Eggs Or Egg-Derived Products     GI symptoms   Other     Tree Nuts GI symptoms and bolbus feeling in throat   Oxycodone Hives   Codeine Hives, Swelling and Rash    No medications prior to admission.  No results found for this or any previous visit (from the past 48 hour(s)). No results found.  Review of Systems  Constitutional:  Negative for chills and fever.  Genitourinary:  Positive for flank pain.  All other systems reviewed and are negative.   Height '5\' 3"'$  (1.6 m), weight 76.2 kg, last menstrual period 05/10/2022, unknown if currently breastfeeding. Physical Exam Vitals reviewed.  HENT:     Head: Normocephalic.  Eyes:     Pupils: Pupils are equal, round, and reactive to light.  Cardiovascular:     Rate and Rhythm: Normal rate.  Abdominal:     Comments: Stable mild truncal obesity  Genitourinary:    Comments: Mild Rt CVAT at present.  Musculoskeletal:        General: Normal range  of motion.     Cervical back: Normal range of motion.  Skin:    General: Skin is warm.  Neurological:     General: No focal deficit present.     Mental Status: She is alert.  Psychiatric:        Mood and Affect: Mood normal.      Assessment/Plan  Proceed as planned with RIGHT ureteroscopic stone manipulation. Risks, benefits, alternatives, expected peri-op course discussed previously and reiterated today.   [name], MD 05/13/2022, 6:43 AM

## 2022-05-13 NOTE — Brief Op Note (Signed)
05/13/2022  4:29 PM  PATIENT:  [name] Huang  47 y.o. female  PRE-OPERATIVE DIAGNOSIS:  RIGHT URETERAL AND RENAL STONES  POST-OPERATIVE DIAGNOSIS:  RIGHT URETERAL AND RENAL STONES  PROCEDURE:  Procedure(s) with comments: CYSTOSCOPY WITH RETROGRADE PYELOGRAM, URETEROSCOPY AND STENT PLACEMENT; FIRST STAGE (Right) - 75 MINS HOLMIUM LASER APPLICATION (Right)  SURGEON:  Surgeon(s) and Role:    [name], MD - Primary  PHYSICIAN ASSISTANT:   ASSISTANTS: none   ANESTHESIA:   general  EBL:  minimal   BLOOD ADMINISTERED:none  DRAINS: none   LOCAL MEDICATIONS USED:  NONE  SPECIMEN:  Source of Specimen:  Rt ureteral / renal stone fragments  DISPOSITION OF SPECIMEN:   [facility] for compositional analysis  COUNTS:  YES  TOURNIQUET:  * No tourniquets in log *  DICTATION: .Other Dictation: Dictation Number [id number]  PLAN OF CARE: Discharge to home after PACU  PATIENT DISPOSITION:  PACU - hemodynamically stable.   Delay start of Pharmacological VTE agent (>24hrs) due to surgical blood loss or risk of bleeding: yes

## 2022-05-14 ENCOUNTER — Encounter (HOSPITAL_COMMUNITY): Payer: Self-pay | Admitting: Urology

## 2022-05-14 NOTE — Op Note (Signed)
[name], Huang MEDICAL RECORD NO: [id number] ACCOUNT NO: [id number] DATE OF BIRTH: [birth year] FACILITY: [facility] LOCATION: [facility] PHYSICIAN: [name], MD  Operative Report   DATE OF PROCEDURE: 05/13/2022  PREOPERATIVE DIAGNOSES:  Right ureteral, right renal stones.  PROCEDURE PERFORMED: 1.  Cystoscopy with right retrograde pyelogram, duration. 2.  Right ureteroscopy with laser lithotripsy, first stage. 3.  Insertion of right ureteral stent.  ESTIMATED BLOOD LOSS:  Nil.  COMPLICATIONS:  None.  SPECIMEN:  Right ureteral and renal stone fragments for composition analysis.  FINDINGS: 1.  Very impacted right distal ureteral stone with proximal hydronephrosis. 2.  Large multifocal lower mid pole renal stone, estimated volume at least 1.5 cm. 3.  Successful removal or ablation approximately 60% right renal stone and 100% right ureteral stone. 4.  Successful placement of right ureteral stent, proximal end in the renal pelvis, distal end in urinary bladder.  No tether.  INDICATIONS:  The patient is a 47 year old lady with remote history of prior urolithiasis who was found to have a right distal ureteral stone several months ago by axial imaging as well as significant volume right intrarenal stones.  She underwent an  appropriate trial of medical therapy.  Her pain resolved and she thought she may have passed her stone; however, her colic returned several days ago quite severely and she is on repeat imaging to still have a right distal ureteral stone in situ of the  right renal stone.  Options were discussed for management including continued medical therapy versus shockwave lithotripsy versus ureteroscopy with goal of the addressing obstructing stone only versus stone free and she wished to proceed with right  ureteroscopy with stone free as the goal.  Informed consent was obtained and placed in medical record.  PROCEDURE IN DETAIL:  The patient being [name] Huang being  verified and procedure being right ureteroscopic stone manipulation was confirmed.  Procedure timeout was performed.  Intravenous antibiotics were administered.  General anesthesia was  induced.  The patient was placed into a low lithotomy position.  Sterile field was created, prepped and draped the patient's vagina, introitus, and proximal thighs using iodine.  Cystourethroscopy was performed using 21-French rigid cystoscope with  offset lens.  Inspection of urinary bladder revealed no diverticula, calcifications, papillary lesions.  The right ureteral orifice was cannulated with 6-French open-ended catheter, and right retrograde pyelogram was obtained.  Right retrograde pyelogram demonstrated single right ureter, single system right kidney.  There was a filling defect in the very distal ureter, likely intramural consistent with a known stone.  There was moderate hydronephrosis above this. Several  attempts were made at placing a safety wire above this.  However, the stone was quite impacted and wire passage alongside it was unfortunately not possible.  As such, the cystoscope was exchanged for a semirigid ureteroscope.  An 8-French feeding tube  placed in the urinary bladder for pressure release and semirigid ureteroscopy performed of the distal right ureter and as anticipated, there was a very impacted stone in the right intramural ureter.  This was much too large for simple basketing.  There  was severe mucosal edema around this. As such, holmium laser energy applied to the stone using a setting of 0.2 joules and 20 Hz.  Approximately 50% of the stone dusted, 50% fragmented, fragments amenable to simple basketing.  During this process as soon  as mucosa-to-mucosa through and through, accessible stone was obtained.  A ZIPwire was advanced set aside as a safety wire.  The right ureteral stent was  completely removed as the patient's goal was to render stone free.  Semirigid ureteroscopy  performed of the  remainder of the distal two-thirds right ureter alongside a separate sensor working wire and the semirigid scope was exchanged for a short length ureteral access sheath to the level of proximal ureter using continuous fluoroscopic  guidance and flexible digital ureteroscopy was performed of proximal right kidney and ureter. As anticipated, there was a right lower mid renal stone.  This was actually within a lower mid infundibulum with some mild infundibular stenosis and not  parenchymal as there was some suspicion to be there approximately three separate stones within this infundibulum, total stone volume of this to be at least 1.5 cm and grossly it was a much larger than anticipated. Holmium laser energy was applied to  stone using escalating settings of 0.2 joules and 40 Hz and using a dusting technique, it was estimated that approximately 60% of her stone volume was dusted. Given the very large stone volume after this point, visualization became inherently incredibly  poor such that it was felt not be prudent to perform further laser lithotripsy and due to the patient's goal of stone free, a staged approach would be most prudent.  As such, the ureteroscope was removed under continuous vision and as was the access  sheath. No significant mucosal abnormalities were found other than the above-mentioned significant mucosal edema in the area of prior stone impaction distally and a new 5 x 24 Polaris type stent was placed over the remaining safety wire using  fluoroscopic guidance.  Good proximal and distal planes were noted.  Procedure was terminated.  The patient tolerated the procedure well, no immediate periprocedural complications.  The patient was taken to postanesthesia care unit in stable condition.   Plan for discharge home and second stage procedure in several weeks.  Additionally, I feel this would be necessary to ensure goal of stone free.   NIK D: 05/13/2022 4:36:12 pm T: 05/14/2022 12:41:00 am   JOB: [id number]/ [id number]

## 2022-05-18 ENCOUNTER — Other Ambulatory Visit: Payer: Self-pay | Admitting: Urology

## 2022-05-21 ENCOUNTER — Encounter: Payer: Self-pay | Admitting: Gastroenterology

## 2022-05-25 NOTE — Progress Notes (Addendum)
For Anesthesia: PCP - [facility] Cardiologist - N/A   Chest x-ray - N/A EKG - N/A Stress Test - N/A ECHO - N/A Cardiac Cath - N/A Pacemaker/ICD device last checked: N/A Pacemaker orders received: N/A Device Rep notified: N/A   Spinal Cord Stimulator: N/A   Sleep Study - N/A CPAP - N/A   Fasting Blood Sugar - N/A Checks Blood Sugar __N/A___ times a day Date and result of last Hgb A1c-N/A   Last dose of GLP1 agonist- N/A GLP1 instructions: N/A   Last dose of SGLT-2 inhibitors- N/A SGLT-2 instructions:N/A   Blood Thinner Instructions:N/A Aspirin Instructions:N/A Last Dose:N/A   Activity level:               Able to exercise without chest pain and/or shortness of breath                            Anesthesia review: N/A   Patient denies shortness of breath, fever, cough and chest pain at PAT appointment     Patient verbalized understanding of instructions reviewed via telephone.

## 2022-05-26 NOTE — Progress Notes (Deleted)
[place] [facility] Phone: [phone] Subjective:    I'm seeing this patient by the request  of:  [name] [place]  CC:   OBS:[id number]  [name] Huang is a 47 y.o. female coming in with complaint of back and neck pain. OMT 02/26/2022. Patient states   Medications patient has been prescribed:   Taking:         Reviewed prior external information including notes and imaging from previsou exam, outside providers and external EMR if available.   As well as notes that were available from care everywhere and other healthcare systems.  Past medical history, social, surgical and family history all reviewed in electronic medical record.  No pertanent information unless stated regarding to the chief complaint.   Past Medical History:  Diagnosis Date   Abnormal colonoscopy    precancerous cells removed   Anemia    Asthma    Carpal tunnel syndrome    IBS (irritable bowel syndrome)    Kidney stone    Mild hyperemesis gravidarum, antepartum    Poison ivy    PP care - 1C/S 5/21 09/23/2011   PUPPP (pruritic urticarial papules and plaques of pregnancy)     Allergies  Allergen Reactions   Amoxicillin Hives    Has patient had a PCN reaction causing immediate rash, facial/tongue/throat swelling, SOB or lightheadedness with hypotension: unknown Has patient had a PCN reaction causing severe rash involving mucus membranes or skin necrosis: unknown Has patient had a PCN reaction that required hospitalization: unknown Has patient had a PCN reaction occurring within the last 10 years: unknown If all of the above answers are "NO", then may proceed with Cephalosporin use.    Eggs Or Egg-Derived Products     GI symptoms   Other     Tree Nuts GI symptoms and bolbus feeling in throat   Oxycodone Hives   Codeine Hives, Swelling and Rash     Review of Systems:  No headache, visual changes,  nausea, vomiting, diarrhea, constipation, dizziness, abdominal pain, skin rash, fevers, chills, night sweats, weight loss, swollen lymph nodes, body aches, joint swelling, chest pain, shortness of breath, mood changes. POSITIVE muscle aches  Objective  Last menstrual period 05/10/2022, unknown if currently breastfeeding.   General: No apparent distress alert and oriented x3 mood and affect normal, dressed appropriately.  HEENT: Pupils equal, extraocular movements intact  Respiratory: Patient's speak in full sentences and does not appear short of breath  Cardiovascular: No lower extremity edema, non tender, no erythema  Gait MSK:  Back   Osteopathic findings  C2 flexed rotated and side bent right C6 flexed rotated and side bent left T3 extended rotated and side bent right inhaled rib T9 extended rotated and side bent left L2 flexed rotated and side bent right Sacrum right on right       Assessment and Plan:  No problem-specific Assessment & Plan notes found for this encounter.    Nonallopathic problems  Decision today to treat with OMT was based on Physical Exam  After verbal consent patient was treated with HVLA, ME, FPR techniques in cervical, rib, thoracic, lumbar, and sacral  areas  Patient tolerated the procedure well with improvement in symptoms  Patient given exercises, stretches and lifestyle modifications  See medications in patient instructions if given  Patient will follow up in 4-8 weeks             Note: This dictation was  prepared with Dragon dictation along with smaller phrase technology. Any transcriptional errors that result from this process are unintentional.

## 2022-05-28 ENCOUNTER — Ambulatory Visit: Payer: Managed Care, Other (non HMO) | Admitting: Family Medicine

## 2022-05-28 ENCOUNTER — Encounter (HOSPITAL_COMMUNITY): Payer: Self-pay | Admitting: Urology

## 2022-06-03 ENCOUNTER — Encounter (HOSPITAL_COMMUNITY): Payer: Self-pay | Admitting: Urology

## 2022-06-03 ENCOUNTER — Other Ambulatory Visit: Payer: Self-pay

## 2022-06-03 NOTE — Progress Notes (Signed)
Anesthesia Review:  PCP: [place]  Cardiologist : none  Chest x-ray : EKG : Echo : Stress test: Cardiac Cath :  Activity level: can do a flight of stairs without difficulty  Sleep Study/ CPAP : none  Fasting Blood Sugar :      / Checks Blood Sugar -- times a day:   Blood Thinner/ Instructions /Last Dose: ASA / Instructions/ Last Dose :   PT had cysto on 05/13/22.  Discharged on 05/13/22.   Medical hx updated on 06/03/22.  Preop instructions completed on 06/03/22.  Pt states at preop call that she thinks she is taking  Tamsulosin every day now.  PT to check when arrives home and call preop nurse back with med name.  PT states she is also taking AZO now.  PT is aware she is to take a shower nite before and am of surgery.   PT concerned about npo from 12 midnite until after surgery.  INstructed pt to call [facility] office and see if they would allow her ot have some clear liquids until 3 hours prior to surgery.  PT voiced understanding and stated she would call them and call preop nurse back as to what they said.

## 2022-06-05 ENCOUNTER — Ambulatory Visit (HOSPITAL_COMMUNITY)
Admission: RE | Admit: 2022-06-05 | Discharge: 2022-06-05 | Disposition: A | Discharge status: Home / Self Care | Payer: Managed Care, Other (non HMO) | Attending: Urology | Admitting: Urology

## 2022-06-05 ENCOUNTER — Ambulatory Visit (HOSPITAL_COMMUNITY): Payer: Managed Care, Other (non HMO) | Admitting: Certified Registered Nurse Anesthetist

## 2022-06-05 ENCOUNTER — Ambulatory Visit (HOSPITAL_BASED_OUTPATIENT_CLINIC_OR_DEPARTMENT_OTHER): Payer: Managed Care, Other (non HMO) | Admitting: Certified Registered Nurse Anesthetist

## 2022-06-05 ENCOUNTER — Encounter (HOSPITAL_COMMUNITY): Payer: Self-pay | Admitting: Urology

## 2022-06-05 ENCOUNTER — Ambulatory Visit (HOSPITAL_COMMUNITY): Payer: Managed Care, Other (non HMO)

## 2022-06-05 ENCOUNTER — Encounter (HOSPITAL_COMMUNITY)
Admission: RE | Disposition: A | Discharge status: Home / Self Care | Payer: Self-pay | Source: Home / Self Care | Attending: Urology

## 2022-06-05 DIAGNOSIS — N289 Disorder of kidney and ureter, unspecified: Secondary | ICD-10-CM

## 2022-06-05 DIAGNOSIS — K589 Irritable bowel syndrome without diarrhea: Secondary | ICD-10-CM | POA: Diagnosis not present

## 2022-06-05 DIAGNOSIS — N200 Calculus of kidney: Secondary | ICD-10-CM | POA: Diagnosis not present

## 2022-06-05 DIAGNOSIS — Z87891 Personal history of nicotine dependence: Secondary | ICD-10-CM | POA: Insufficient documentation

## 2022-06-05 DIAGNOSIS — D649 Anemia, unspecified: Secondary | ICD-10-CM

## 2022-06-05 DIAGNOSIS — J45909 Unspecified asthma, uncomplicated: Secondary | ICD-10-CM | POA: Diagnosis not present

## 2022-06-05 DIAGNOSIS — E669 Obesity, unspecified: Secondary | ICD-10-CM | POA: Diagnosis not present

## 2022-06-05 DIAGNOSIS — N202 Calculus of kidney with calculus of ureter: Secondary | ICD-10-CM | POA: Diagnosis present

## 2022-06-05 DIAGNOSIS — Z01818 Encounter for other preprocedural examination: Secondary | ICD-10-CM

## 2022-06-05 HISTORY — PX: HOLMIUM LASER APPLICATION: SHX5852

## 2022-06-05 HISTORY — PX: CYSTOSCOPY WITH RETROGRADE PYELOGRAM, URETEROSCOPY AND STENT PLACEMENT: SHX5789

## 2022-06-05 HISTORY — DX: Personal history of urinary calculi: Z87.442

## 2022-06-05 LAB — BASIC METABOLIC PANEL
Anion gap: 9 (ref 5–15)
BUN: 16 mg/dL (ref 6–20)
CO2: 21 mmol/L — ABNORMAL LOW (ref 22–32)
Calcium: 8.3 mg/dL — ABNORMAL LOW (ref 8.9–10.3)
Chloride: 108 mmol/L (ref 98–111)
Creatinine, Ser: 0.89 mg/dL (ref 0.44–1.00)
GFR, Estimated: >60 mL/min (ref >60)
Glucose, Bld: 83 mg/dL (ref 70–99)
Potassium: 3.9 mmol/L (ref 3.5–5.1)
Sodium: 138 mmol/L (ref 135–145)

## 2022-06-05 LAB — CBC
HCT: 36.4 % (ref 36.0–46.0)
Hemoglobin: 11.9 g/dL — ABNORMAL LOW (ref 12.0–15.0)
MCH: 28.5 pg (ref 26.0–34.0)
MCHC: 32.7 g/dL (ref 30.0–36.0)
MCV: 87.3 fL (ref 80.0–100.0)
Platelets: 286 10*3/uL (ref 150–400)
RBC: 4.17 MIL/uL (ref 3.87–5.11)
RDW: 12.3 % (ref 11.5–15.5)
WBC: 6.6 10*3/uL (ref 4.0–10.5)
nRBC: 0 % (ref 0.0–0.2)

## 2022-06-05 LAB — POCT PREGNANCY, URINE: Preg Test, Ur: NEGATIVE

## 2022-06-05 SURGERY — CYSTOURETEROSCOPY, WITH RETROGRADE PYELOGRAM AND STENT INSERTION
Anesthesia: General | Laterality: Right

## 2022-06-05 MED ORDER — ACETAMINOPHEN 10 MG/ML IV SOLN
INTRAVENOUS | Status: DC | PRN
  Administered 2022-06-05: 1000 mg via INTRAVENOUS

## 2022-06-05 MED ORDER — ACETAMINOPHEN 500 MG PO TABS
1000.0000 mg | ORAL_TABLET | Freq: Once | ORAL | Status: DC
  Filled 2022-06-05: qty 2

## 2022-06-05 MED ORDER — KETOROLAC TROMETHAMINE 15 MG/ML IJ SOLN
15.0000 mg | Freq: Once | INTRAMUSCULAR | Status: AC
  Administered 2022-06-05: 15 mg via INTRAVENOUS

## 2022-06-05 MED ORDER — ONDANSETRON HCL 4 MG/2ML IJ SOLN
INTRAMUSCULAR | Status: AC
  Filled 2022-06-05: qty 2

## 2022-06-05 MED ORDER — KETOROLAC TROMETHAMINE 10 MG PO TABS: 10.0000 mg | ORAL_TABLET | Freq: Three times a day (TID) | ORAL | 0 refills | Status: DC | PRN

## 2022-06-05 MED ORDER — PROPOFOL 10 MG/ML IV BOLUS
INTRAVENOUS | Status: AC
  Filled 2022-06-05: qty 20

## 2022-06-05 MED ORDER — FENTANYL CITRATE (PF) 100 MCG/2ML IJ SOLN
INTRAMUSCULAR | Status: DC | PRN
  Administered 2022-06-05: 25 ug via INTRAVENOUS

## 2022-06-05 MED ORDER — PROMETHAZINE HCL 25 MG/ML IJ SOLN: 6.2500 mg | INTRAMUSCULAR | Status: DC | PRN

## 2022-06-05 MED ORDER — CIPROFLOXACIN HCL 500 MG PO TABS: 500.0000 mg | ORAL_TABLET | Freq: Every day | ORAL | 0 refills | Status: AC

## 2022-06-05 MED ORDER — KETOROLAC TROMETHAMINE 15 MG/ML IJ SOLN
INTRAMUSCULAR | Status: AC
  Filled 2022-06-05: qty 1

## 2022-06-05 MED ORDER — CIPROFLOXACIN IN D5W 400 MG/200ML IV SOLN
INTRAVENOUS | Status: DC | PRN
  Administered 2022-06-05: 400 mg via INTRAVENOUS

## 2022-06-05 MED ORDER — FENTANYL CITRATE (PF) 100 MCG/2ML IJ SOLN
INTRAMUSCULAR | Status: AC
  Filled 2022-06-05: qty 2

## 2022-06-05 MED ORDER — SODIUM CHLORIDE 0.9 % IR SOLN
Status: DC | PRN
  Administered 2022-06-05: 3000 mL

## 2022-06-05 MED ORDER — CHLORHEXIDINE GLUCONATE 0.12 % MT SOLN: 15.0000 mL | Freq: Once | OROMUCOSAL | Status: DC

## 2022-06-05 MED ORDER — LIDOCAINE 2% (20 MG/ML) 5 ML SYRINGE
INTRAMUSCULAR | Status: DC | PRN
  Administered 2022-06-05: 80 mg via INTRAVENOUS

## 2022-06-05 MED ORDER — DEXAMETHASONE SODIUM PHOSPHATE 10 MG/ML IJ SOLN
INTRAMUSCULAR | Status: AC
  Filled 2022-06-05: qty 1

## 2022-06-05 MED ORDER — ONDANSETRON HCL 4 MG/2ML IJ SOLN
INTRAMUSCULAR | Status: DC | PRN
  Administered 2022-06-05: 4 mg via INTRAVENOUS

## 2022-06-05 MED ORDER — GENTAMICIN SULFATE 40 MG/ML IJ SOLN
5.0000 mg/kg | INTRAVENOUS | Status: AC
  Administered 2022-06-05: 380 mg via INTRAVENOUS
  Filled 2022-06-05: qty 9.5

## 2022-06-05 MED ORDER — ACETAMINOPHEN 10 MG/ML IV SOLN
INTRAVENOUS | Status: AC
  Filled 2022-06-05: qty 100

## 2022-06-05 MED ORDER — MIDAZOLAM HCL 2 MG/2ML IJ SOLN
INTRAMUSCULAR | Status: AC
  Filled 2022-06-05: qty 2

## 2022-06-05 MED ORDER — TRAMADOL HCL 50 MG PO TABS: 50.0000 mg | ORAL_TABLET | Freq: Four times a day (QID) | ORAL | 0 refills | Status: AC | PRN

## 2022-06-05 MED ORDER — IOHEXOL 300 MG/ML  SOLN
INTRAMUSCULAR | Status: DC | PRN
  Administered 2022-06-05: 20 mL

## 2022-06-05 MED ORDER — PROPOFOL 10 MG/ML IV BOLUS
INTRAVENOUS | Status: DC | PRN
  Administered 2022-06-05: 180 mg via INTRAVENOUS

## 2022-06-05 MED ORDER — CIPROFLOXACIN IN D5W 400 MG/200ML IV SOLN
INTRAVENOUS | Status: AC
  Filled 2022-06-05: qty 200

## 2022-06-05 MED ORDER — DEXAMETHASONE SODIUM PHOSPHATE 10 MG/ML IJ SOLN
INTRAMUSCULAR | Status: DC | PRN
  Administered 2022-06-05: 4 mg via INTRAVENOUS

## 2022-06-05 MED ORDER — FENTANYL CITRATE PF 50 MCG/ML IJ SOSY: 25.0000 ug | PREFILLED_SYRINGE | INTRAMUSCULAR | Status: DC | PRN

## 2022-06-05 MED ORDER — SCOPOLAMINE 1 MG/3DAYS TD PT72
1.0000 | MEDICATED_PATCH | Freq: Once | TRANSDERMAL | Status: DC
  Administered 2022-06-05: 1.5 mg via TRANSDERMAL
  Filled 2022-06-05: qty 1

## 2022-06-05 MED ORDER — DIPHENHYDRAMINE HCL 50 MG/ML IJ SOLN
INTRAMUSCULAR | Status: AC
  Filled 2022-06-05: qty 1

## 2022-06-05 MED ORDER — LIDOCAINE HCL (PF) 2 % IJ SOLN
INTRAMUSCULAR | Status: AC
  Filled 2022-06-05: qty 5

## 2022-06-05 MED ORDER — ORAL CARE MOUTH RINSE: 15.0000 mL | Freq: Once | OROMUCOSAL | Status: DC

## 2022-06-05 MED ORDER — MIDAZOLAM HCL 5 MG/5ML IJ SOLN
INTRAMUSCULAR | Status: DC | PRN
  Administered 2022-06-05: 2 mg via INTRAVENOUS

## 2022-06-05 MED ORDER — LACTATED RINGERS IV SOLN: INTRAVENOUS | Status: DC

## 2022-06-05 SURGICAL SUPPLY — 25 items
BAG URO CATCHER STRL LF (MISCELLANEOUS) ×2 IMPLANT
BASKET LASER NITINOL 1.9FR (BASKET) IMPLANT
BSKT STON RTRVL 120 1.9FR (BASKET)
CATH URETL OPEN END 6FR 70 (CATHETERS) ×2 IMPLANT
CLOTH BEACON ORANGE TIMEOUT ST (SAFETY) ×2 IMPLANT
EXTRACTOR STONE 1.7FRX115CM (UROLOGICAL SUPPLIES) IMPLANT
GLOVE SURG LX STRL 7.5 STRW (GLOVE) ×2 IMPLANT
GOWN STRL REUS W/ TWL XL LVL3 (GOWN DISPOSABLE) ×2 IMPLANT
GOWN STRL REUS W/TWL XL LVL3 (GOWN DISPOSABLE) ×1
GUIDEWIRE ANG ZIPWIRE 038X150 (WIRE) ×2 IMPLANT
GUIDEWIRE STR DUAL SENSOR (WIRE) ×2 IMPLANT
KIT TURNOVER KIT A (KITS) IMPLANT
LASER FIB FLEXIVA PULSE ID 365 (Laser) IMPLANT
LASER FIB FLEXIVA PULSE ID 550 (Laser) IMPLANT
LASER FIB FLEXIVA PULSE ID 910 (Laser) IMPLANT
MANIFOLD NEPTUNE II (INSTRUMENTS) ×2 IMPLANT
PACK CYSTO (CUSTOM PROCEDURE TRAY) ×2 IMPLANT
SHEATH NAVIGATOR HD 11/13X28 (SHEATH) IMPLANT
SHEATH NAVIGATOR HD 11/13X36 (SHEATH) IMPLANT
STENT POLARIS 5FRX22 (STENTS) IMPLANT
TRACTIP FLEXIVA PULS ID 200XHI (Laser) IMPLANT
TRACTIP FLEXIVA PULSE ID 200 (Laser)
TUBE PU 8FR 16IN ENFIT (TUBING) ×2 IMPLANT
TUBING CONNECTING 10 (TUBING) ×2 IMPLANT
TUBING UROLOGY SET (TUBING) ×2 IMPLANT

## 2022-06-05 NOTE — Transfer of Care (Signed)
Immediate Anesthesia Transfer of Care Note  Patient: Bayleigh Loflin  Procedure(s) Performed: SECOND STAGE CYSTOSCOPY WITH RETROGRADE PYELOGRAM, URETEROSCOPY AND STENT EXCHANGE (Right) HOLMIUM LASER APPLICATION (Right)  Patient Location: PACU  Anesthesia Type:General  Level of Consciousness: drowsy  Airway & Oxygen Therapy: Patient Spontanous Breathing and Patient connected to face mask oxygen  Post-op Assessment: Report given to RN, Post -op Vital signs reviewed and stable, and Patient moving all extremities X 4  Post vital signs: Reviewed and stable  Last Vitals:  Vitals Value Taken Time  BP 126/89   Temp    Pulse 99   Resp 20 06/05/22 1531  SpO2 100   Vitals shown include unvalidated device data.  Last Pain:  Vitals:   06/05/22 1400  TempSrc:   PainSc: 0-No pain      Patients Stated Pain Goal: 4 (93/81/82 9937)  Complications: No notable events documented.

## 2022-06-05 NOTE — Anesthesia Preprocedure Evaluation (Addendum)
Anesthesia Evaluation  Patient identified by MRN, date of birth, ID band Patient awake    Reviewed: Allergy & Precautions, NPO status , Patient's Chart, lab work & pertinent test results  Airway Mallampati: II  TM Distance: >3 FB Neck ROM: Full    Dental  (+) Teeth Intact, Dental Advisory Given   Pulmonary asthma , former smoker   Pulmonary exam normal breath sounds clear to auscultation       Cardiovascular Exercise Tolerance: Good negative cardio ROS Normal cardiovascular exam Rhythm:Regular Rate:Normal     Neuro/Psych  Neuromuscular disease    GI/Hepatic negative GI ROS, Neg liver ROS,,,  Endo/Other  negative endocrine ROS    Renal/GU RESIDUAL RIGHT RENAL STONE     Musculoskeletal negative musculoskeletal ROS (+)    Abdominal   Peds  Hematology negative hematology ROS (+)   Anesthesia Other Findings Day of surgery medications reviewed with the patient.  Reproductive/Obstetrics negative OB ROS                             Anesthesia Physical Anesthesia Plan  ASA: 2  Anesthesia Plan: General   Post-op Pain Management: Tylenol PO (pre-op)*   Induction: Intravenous  PONV Risk Score and Plan: 4 or greater and Scopolamine patch - Pre-op, Midazolam, Dexamethasone and Ondansetron  Airway Management Planned: LMA  Additional Equipment:   Intra-op Plan:   Post-operative Plan: Extubation in OR  Informed Consent: I have reviewed the patients History and Physical, chart, labs and discussed the procedure including the risks, benefits and alternatives for the proposed anesthesia with the patient or authorized representative who has indicated his/her understanding and acceptance.     Dental advisory given  Plan Discussed with: CRNA  Anesthesia Plan Comments:        Anesthesia Quick Evaluation

## 2022-06-05 NOTE — H&P (Signed)
[name] Huang is an 47 y.o. female.    Chief Complaint: Pre-Op RIGHT Second Stage Ureteroscopic Stone Manipulation  HPI:   1 Recurrent Urolithiasis -  2012 - MET x 1 after miscarriage  04/2022 - 55m Rt distal stone with mod hydro + 155mRt renal/parenchymal stone by ER CT (underestimate)  PMH sig for mild obesity, IBS, Asthma, breast reduction / abdominoplasty. She works remote for [employer] [place] [facility]  Today "[name] Huang seen to proceed with RIGHT second ureteroscopic stone manipulation for large voluem Rt urolithiasis. Had 1st stage procedure 05/13/22 where estimate >50% stone volume adressed.  Most recnet UCX negative.  No interval fevers, some stent colic as expected that is worse with activity.   Past Medical History:  Diagnosis Date   Abnormal colonoscopy    precancerous cells removed   Anemia    Asthma    Carpal tunnel syndrome    History of kidney stones    IBS (irritable bowel syndrome)    Kidney stone    Mild hyperemesis gravidarum, antepartum    Poison ivy    PP care - 1C/S 5/21 09/23/2011   PUPPP (pruritic urticarial papules and plaques of pregnancy)     Past Surgical History:  Procedure Laterality Date   ABDOMINOPLASTY     BREAST REDUCTION SURGERY  09/2021   CESAREAN SECTION  09/22/2011   Procedure: CESAREAN SECTION;  Surgeon: RiLovenia KimMD;  [facility];  Service: Gynecology;  Laterality: N/A;   CESAREAN SECTION N/A 04/04/2013   Procedure: Repeat CESAREAN SECTION;  Surgeon: RiLovenia KimMD;  [facility];  Service: Obstetrics;  Laterality: N/A;  EDD: 04/05/13   CESAREAN SECTION N/A 06/21/2015   Procedure: Repeat CESAREAN SECTION;  Surgeon: RiBrien FewMD;  [facility];  Service: Obstetrics;  Laterality: N/A;  EDD: 06/28/15    COLONOSCOPY     CYSTOSCOPY WITH RETROGRADE PYELOGRAM, URETEROSCOPY AND STENT PLACEMENT Right 05/13/2022   Procedure: CYSTOSCOPY WITH RETROGRADE  PYELOGRAM, URETEROSCOPY AND STENT PLACEMENT; FIRST STAGE;  Surgeon: MaAlexis FrockMD;  Location: [facility];  Service: Urology;  Laterality: Right;  75 MINS   HOLMIUM LASER APPLICATION Right 05/07/98/8676 Procedure: HOLMIUM LASER APPLICATION;  Surgeon: MaAlexis FrockMD;  Location: [facility];  Service: Urology;  Laterality: Right;   ROOT CANAL     WISDOM TOOTH EXTRACTION      Family History  Problem Relation Age of Onset   Hypertension Mother    Diabetes Mother    Peripheral vascular disease Mother    Hypertension Father    Diabetes Father    Mental illness Father        bipolar   Heart attack Paternal Grandfather    Heart disease Paternal Grandfather    Colon cancer Other    Breast cancer Maternal Aunt    Breast cancer Maternal Grandmother    Anesthesia problems Neg Hx    Hypotension Neg Hx    Malignant hyperthermia Neg Hx    Pseudochol deficiency Neg Hx    Social History:  reports that she quit smoking about 17 years ago. Her smoking use included cigarettes. She has a 3.00 pack-year smoking history. She has never used smokeless tobacco. She reports current alcohol use. She reports that she does not use drugs.  Allergies:  Allergies  Allergen Reactions   Amoxicillin Hives    Has patient had a PCN reaction causing immediate rash, facial/tongue/throat swelling, SOB  or lightheadedness with hypotension: unknown Has patient had a PCN reaction causing severe rash involving mucus membranes or skin necrosis: unknown Has patient had a PCN reaction that required hospitalization: unknown Has patient had a PCN reaction occurring within the last 10 years: unknown If all of the above answers are "NO", then may proceed with Cephalosporin use.    Eggs Or Egg-Derived Products     GI symptoms   Other     Tree Nuts GI symptoms and bolbus feeling in throat   Oxycodone Hives    No medications prior to admission.    No results found for this or any previous visit (from the past 48  hour(s)). No results found.  Review of Systems  Constitutional:  Negative for chills and fever.  Genitourinary:  Positive for hematuria and urgency.  All other systems reviewed and are negative.   Last menstrual period 05/10/2022, unknown if currently breastfeeding. Physical Exam Vitals reviewed.  Eyes:     Pupils: Pupils are equal, round, and reactive to light.  Cardiovascular:     Rate and Rhythm: Normal rate.  Pulmonary:     Effort: Pulmonary effort is normal.  Abdominal:     General: Abdomen is flat.  Genitourinary:    Comments: No CVAT at present Musculoskeletal:        General: Normal range of motion.     Cervical back: Normal range of motion.  Neurological:     General: No focal deficit present.     Mental Status: She is alert.  Psychiatric:        Mood and Affect: Mood normal.      Assessment/Plan  Proceed as planned with RIGHT 2nd stage ureteroscopy with gaol of stone free. Risks,benefits, alternatives, expectred peri-op course discussed previously and reiterated today includinng need for possivle stent exchange pending volume of stone and ureteral edema.   [name], MD 06/05/2022, 7:27 AM

## 2022-06-05 NOTE — Brief Op Note (Signed)
06/05/2022  3:20 PM  PATIENT:  Lajuana Ripple  47 y.o. female  PRE-OPERATIVE DIAGNOSIS:  RESIDUAL RIGHT RENAL STONE  POST-OPERATIVE DIAGNOSIS:  RESIDUAL RIGHT RENAL STONE  PROCEDURE:  Procedure(s) with comments: SECOND STAGE CYSTOSCOPY WITH RETROGRADE PYELOGRAM, URETEROSCOPY AND STENT EXCHANGE (Right) - 90 MINS HOLMIUM LASER APPLICATION (Right)  SURGEON:  Surgeon(s) and Role:    * [name], MD - Primary  PHYSICIAN ASSISTANT:   ASSISTANTS: none   ANESTHESIA:   general  EBL:  minmal   BLOOD ADMINISTERED:none  DRAINS: none   LOCAL MEDICATIONS USED:  NONE  SPECIMEN:  No Specimen  DISPOSITION OF SPECIMEN:  N/A  COUNTS:  YES  TOURNIQUET:  * No tourniquets in log *  DICTATION: .Other Dictation: Dictation Number [id number]  PLAN OF CARE: Discharge to home after PACU  PATIENT DISPOSITION:  PACU - hemodynamically stable.   Delay start of Pharmacological VTE agent (>24hrs) due to surgical blood loss or risk of bleeding: not applicable

## 2022-06-05 NOTE — Discharge Instructions (Addendum)
1 - You may have urinary urgency (bladder spasms) and bloody urine on / off with stent in place. This is normal. ° °2 - Remove tethered stent on Monday morning at home by pulling on string, then blue-white plastic tubing, and discarding. Office is open Monday if any problems arise.  ° °3 - Call MD or go to ER for fever >102, severe pain / nausea / vomiting not relieved by medications, or acute change in medical status ° °

## 2022-06-05 NOTE — Op Note (Unsigned)
[name] Huang, [name] Huang MEDICAL RECORD NO: [id number] ACCOUNT NO: [id number] DATE OF BIRTH: 11-Jan-1976 FACILITY: [facility] LOCATION: [facility] PHYSICIAN: [name], MD  Operative Report   DATE OF PROCEDURE: 06/05/2022  PREOPERATIVE DIAGNOSIS:  Residual right renal stone, status post first stage procedure.  PROCEDURE PERFORMED: 1.  Cystoscopy with retrograde pyelogram interpretation. 2.  Right second stage ureteroscopy with laser lithotripsy. 3.  Exchange of right ureteral stent.  ESTIMATED BLOOD LOSS:  Nil.  COMPLICATIONS:  None.  SPECIMEN:  None.  FINDINGS: 1.  Significant interval resolution of residual right renal stone material. Total residual stone volume approximately 6 mm. 2.  Complete resolution of all accessible stone fragments larger than one-third mm following laser lithotripsy. 3.  Successful exchange of right ureteral stent, proximal end in the renal pelvis, distal in urinary bladder, with tether.  INDICATIONS:  The patient is a pleasant 47 year old lady who was found on workup of colicky flank pain to have a right distal ureteral stones with fairly large volume right renal stone.  Given the multifocality of her stone she wished to undergo ureteroscopy ureteroscopy with goal of stone free.  She underwent first stage ureteroscopy last month, at which point her ureteral stone was addressed and the majority of her right renal stone was fragmented.  Given the large volume of right renal stone, I felt that  second stage procedure was clearly warranted to verify stone free as this is her goal.  Informed consent was obtained and placed in medical record.  PROCEDURE IN DETAIL:  The patient being [name] Huang verified and the procedure being right second stage ureteroscopic stone manipulation was confirmed.  Procedure timeout was performed.  Intravenous antibiotics were administered.  General anesthesia was  induced.  The patient was placed into a low lithotomy position.  Sterile  field was created, prepped and draped the patient's vagina, introitus, and proximal thighs using iodine.  Cystourethroscopy was performed using 21-French rigid cystoscope with  offset lens.  Inspection of the urinary bladder revealed no diverticula, calcifications or papillary lesions.  Distal end right ureteral stent was seen in situ.  It was grasped, brought to the level of the urethral meatus and a 0.038 ZIPwire was advanced  to the level of the upper pole, exchanged for open-ended catheter and right retrograde pyelogram was obtained.  Right retrograde pyelogram demonstrated single right ureter, single system right kidney.  No filling defects or narrowing noted.  A ZIPwire was once again advanced, set aside as a safety wire.  An 8-French feeding tube placed in the urinary bladder for  pressure release.  Semirigid ureteroscopy was then performed to distal orifice of the right ureter alongside a separate sensor working wire.  No mucosal abnormalities found.  No stone material was found.  The semirigid scope was then exchanged for a  short length ureteral access sheath over the sensor working wire to the level of proximal ureter, using continuous fluoroscopic guidance the flexible digital ureteroscopy was then performed in the proximal ureter and systematic inspection of the right  kidney, including all calices x3 demonstrated amazing amount of interval clearance of the residual stone material.  The dominant stone, which had previously been in the right mid calix was slightly narrowed infundibulum.  This nearly completely resolved.   There was scant residual stone material within the same calix, which appeared to be attached to a mucosal nidus.  Estimated total volume approximately 6 mm2 and adjacent calix.  As such, holmium laser energy applied to stone using settings of 1  joule and 10 Hz and the residual stone fragment with its mucosal nidus was completely ablated into fragments that were less than  one-third mm.  There was a small mucosal stone in an adjacent calix.  It was similarly ablated.  Following this, complete  resolution of all accessible stone fragments larger than one-third mm, excellent hemostasis, no evidence of perforation.  Access sheath was removed under continuous vision, no significant mucosal abnormalities are found.  Given access sheath usage, it  was felt that brief interval stenting with tethered stent would be most prudent.  As such, a new 5 x 22 Polaris type stent was placed using fluoroscopic guidance.  Good proximal and distal plane were noted.  Distal end was purposely floated such that it  lie in the distal aspect of the intramural ureter as she did have trouble with stent colic previously.  Tether was left in place, trimmed to length and tucked per vagina and the procedure was terminated.  The patient tolerated the procedure well, no immediate  periprocedural complications.  The patient was taken to postanesthesia care unit in stable condition.  Plan for discharge home.   SHY D: 06/05/2022 3:25:36 pm T: 06/05/2022 9:15:00 pm  JOB: [id number]/ [id number]

## 2022-06-05 NOTE — Anesthesia Procedure Notes (Signed)
Procedure Name: LMA Insertion Date/Time: 06/05/2022 2:48 PM  Performed by: Niel Hummer, CRNAPre-anesthesia Checklist: Patient identified, Emergency Drugs available, Suction available and Patient being monitored Patient Re-evaluated:Patient Re-evaluated prior to induction Oxygen Delivery Method: Circle system utilized Preoxygenation: Pre-oxygenation with 100% oxygen Induction Type: IV induction LMA: LMA with gastric port inserted LMA Size: 4.0 Number of attempts: 1 Dental Injury: Teeth and Oropharynx as per pre-operative assessment

## 2022-06-07 ENCOUNTER — Encounter (HOSPITAL_COMMUNITY): Payer: Self-pay | Admitting: Urology

## 2022-06-07 NOTE — Anesthesia Postprocedure Evaluation (Signed)
Anesthesia Post Note  Patient: [name] Huang  Procedure(s) Performed: SECOND STAGE CYSTOSCOPY WITH RETROGRADE PYELOGRAM, URETEROSCOPY AND STENT EXCHANGE (Right) HOLMIUM LASER APPLICATION (Right)     Patient location during evaluation: PACU Anesthesia Type: General Level of consciousness: awake and alert Pain management: pain level controlled Vital Signs Assessment: post-procedure vital signs reviewed and stable Respiratory status: spontaneous breathing, nonlabored ventilation, respiratory function stable and patient connected to nasal cannula oxygen Cardiovascular status: blood pressure returned to baseline and stable Postop Assessment: no apparent nausea or vomiting Anesthetic complications: no   No notable events documented.  Last Vitals:  Vitals:   06/05/22 1327 06/05/22 1600  BP: 127/81 124/84  Pulse: 77 65  Resp: 16 15  Temp: 36.9 C (!) 36.4 C  SpO2: 97% 100%    Last Pain:  Vitals:   06/05/22 1600  TempSrc:   PainSc: 0-No pain                 Santa Lighter

## 2022-06-14 IMAGING — DX DG CERVICAL SPINE 2 OR 3 VIEWS
3 series · 3 of 3 positions shown · non-contrast
Comparison: None.

CLINICAL DATA: Neck pain

EXAM:
CERVICAL SPINE - 2-3 VIEW

[c-spine lat]
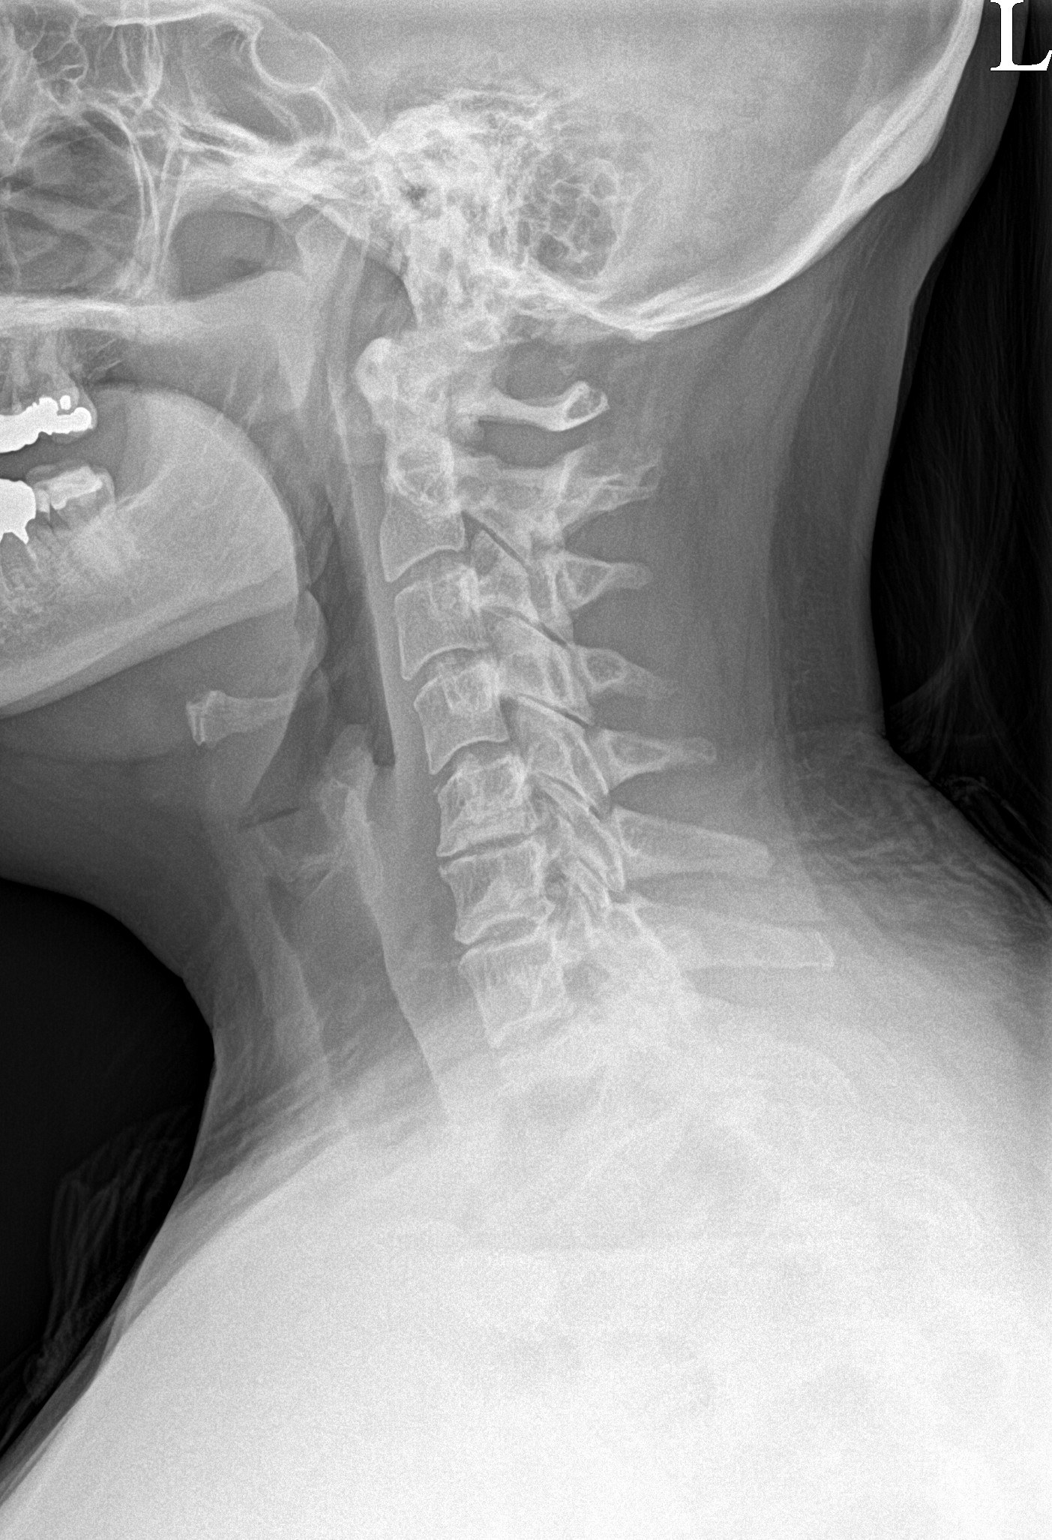

[c-spine ap]
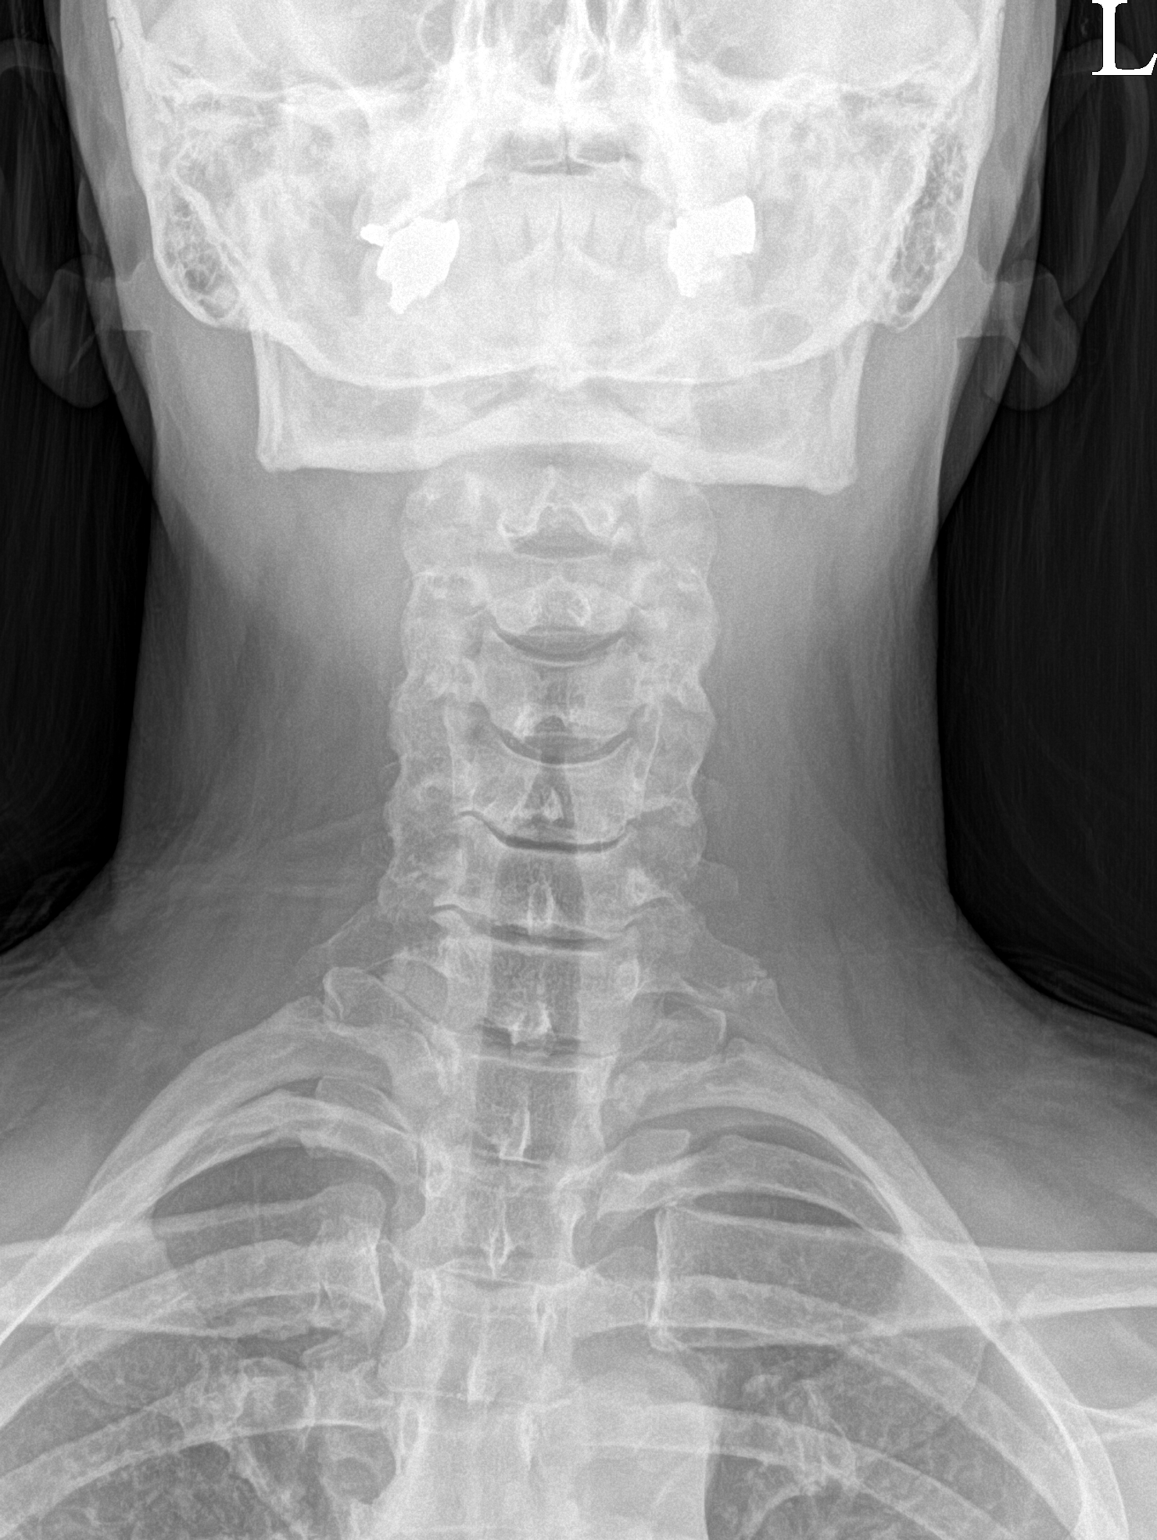

[c-spine open mouth]
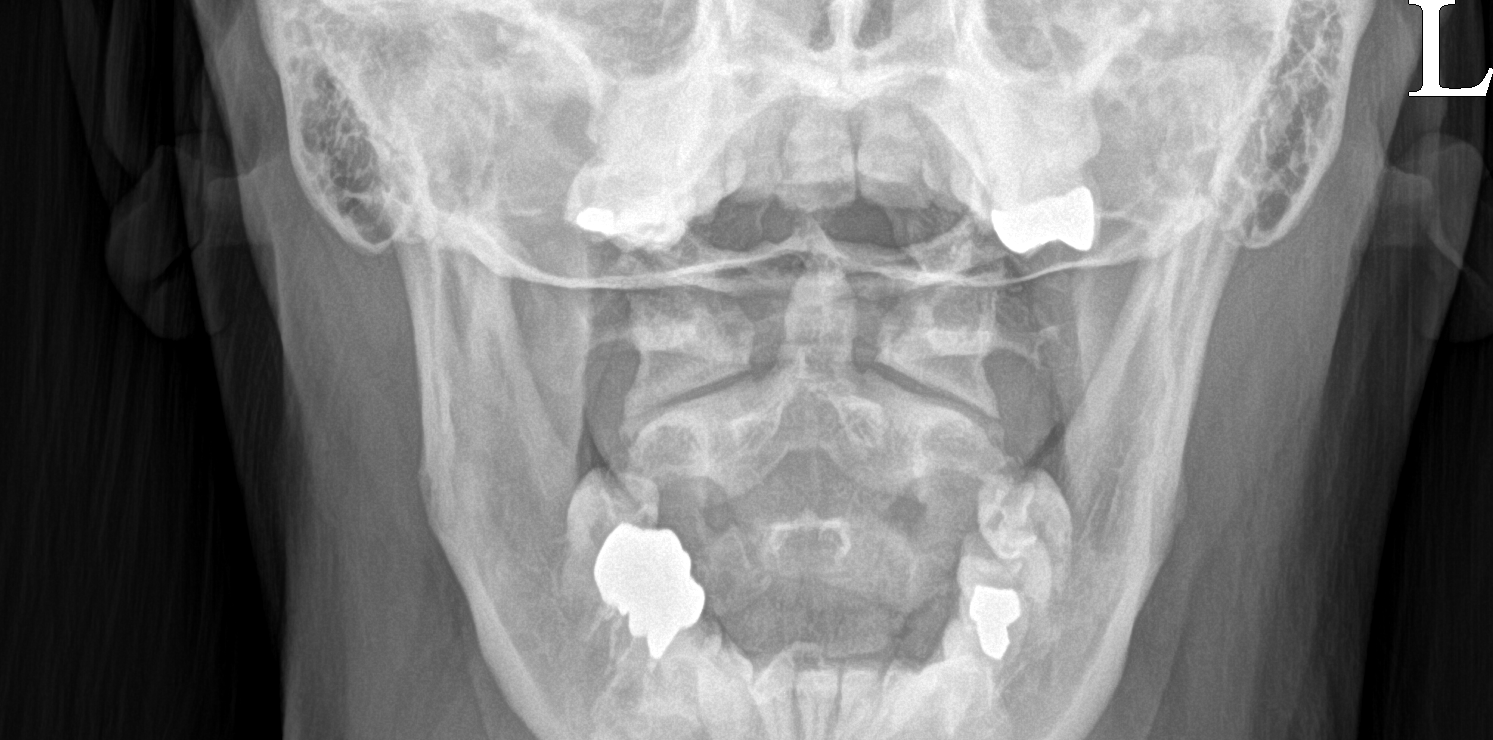

[3 of 3 positions shown; findings below may reference images not displayed]

FINDINGS: Mild straightening of the cervical spine. No acute fracture or
listhesis. Intervertebral disc space narrowing and endplate
remodeling at C5-C7 in keeping with changes of mild to moderate
degenerative disc disease. Remaining intervertebral disc spaces are
preserved. Vertebral body heights are preserved. Prevertebral soft
tissues are not thickened. Spinal canal is widely patent.
IMPRESSION: Mild to moderate degenerative disc disease C5-C7.

## 2023-07-27 ENCOUNTER — Ambulatory Visit: Payer: Self-pay | Admitting: Audiology

## 2023-09-22 NOTE — Progress Notes (Signed)
 [name] Huang [address] [place] [address] Phone: [phone] Subjective:   [name] Huang, am serving as a scribe for Dr. Ronnell Huang.  I'm seeing this patient by the request  of:  [name] Huang, [name] Huang @ [facility]  CC: Right arm pain  NGE:[id number]  [name] Huang is a 48 y.o. female coming in with complaint of R bicep pain. Last seen for OMT in 2023. Patient states that before winter she was carrying a lot of groceries in the  R arm. Pain went away but recently she has had more pain in bicep. Recently started training with a personal trainer and she mentions moving a couch recently. Pain throughout bicep. Has been icing and using a sling at times.        Past Medical History:  Diagnosis Date   Abnormal colonoscopy    precancerous cells removed   Anemia    Asthma    Carpal tunnel syndrome    History of kidney stones    IBS (irritable bowel syndrome)    Kidney stone    Mild hyperemesis gravidarum, antepartum    Poison ivy    PP care - 1C/S 5/21 09/23/2011   PUPPP (pruritic urticarial papules and plaques of pregnancy)    Past Surgical History:  Procedure Laterality Date   ABDOMINOPLASTY     BREAST REDUCTION SURGERY  09/2021   CESAREAN SECTION  09/22/2011   Procedure: CESAREAN SECTION;  Surgeon: [name] Celestine, MD;  Location: [facility];  Service: Gynecology;  Laterality: N/A;   CESAREAN SECTION N/A 04/04/2013   Procedure: Repeat CESAREAN SECTION;  Surgeon: [name] Celestine, MD;  Location: [facility];  Service: Obstetrics;  Laterality: N/A;  EDD: 04/05/13   CESAREAN SECTION N/A 06/21/2015   Procedure: Repeat CESAREAN SECTION;  Surgeon: [name] Stamp, MD;  Location: [facility];  Service: Obstetrics;  Laterality: N/A;  EDD: 06/28/15    COLONOSCOPY     CYSTOSCOPY WITH RETROGRADE PYELOGRAM, URETEROSCOPY AND STENT PLACEMENT Right 05/13/2022   Procedure: CYSTOSCOPY WITH RETROGRADE PYELOGRAM, URETEROSCOPY AND STENT PLACEMENT; FIRST STAGE;   Surgeon: [name] Blaze, MD;  Location: [facility];  Service: Urology;  Laterality: Right;  75 MINS   CYSTOSCOPY WITH RETROGRADE PYELOGRAM, URETEROSCOPY AND STENT PLACEMENT Right 06/05/2022   Procedure: SECOND STAGE CYSTOSCOPY WITH RETROGRADE PYELOGRAM, URETEROSCOPY AND STENT EXCHANGE;  Surgeon: [name] Blaze, MD;  Location: [facility];  Service: Urology;  Laterality: Right;  90 MINS   HOLMIUM LASER APPLICATION Right 05/13/2022   Procedure: HOLMIUM LASER APPLICATION;  Surgeon: [name] Blaze, MD;  Location: [facility];  Service: Urology;  Laterality: Right;   HOLMIUM LASER APPLICATION Right 06/05/2022   Procedure: HOLMIUM LASER APPLICATION;  Surgeon: [name] Blaze, MD;  Location: [facility];  Service: Urology;  Laterality: Right;   ROOT CANAL     WISDOM TOOTH EXTRACTION     Social History   Socioeconomic History   Marital status: Divorced    Spouse name: Not on file   Number of children: Not on file   Years of education: Not on file   Highest education level: Not on file  Occupational History   Not on file  Tobacco Use   Smoking status: Former    Current packs/day: 0.00    Average packs/day: 0.5 packs/day for 6.0 years (3.0 ttl pk-yrs)    Types: Cigarettes    Start date: 11/20/1998    Quit date: 11/19/2004    Years since quitting: 18.8   Smokeless tobacco: Never  Vaping  Use   Vaping status: Never Used  Substance and Sexual Activity   Alcohol use: Yes    Comment: twice monthly   Drug use: No   Sexual activity: Yes  Other Topics Concern   Not on file  Social History Narrative   Not on file   Social Drivers of Health   Financial Resource Strain: Not on file  Food Insecurity: Not on file  Transportation Needs: Not on file  Physical Activity: Not on file  Stress: Not on file  Social Connections: Unknown (03/23/2022)   Received from [facility]   Social Network    Social Network: Not on file   Allergies  Allergen Reactions   Chlorhexidine  Itching and Rash    Red and itching after  using CHG wipes and shaving   Amoxicillin Hives    Has patient had a PCN reaction causing immediate rash, facial/tongue/throat swelling, SOB or lightheadedness with hypotension: unknown Has patient had a PCN reaction causing severe rash involving mucus membranes or skin necrosis: unknown Has patient had a PCN reaction that required hospitalization: unknown Has patient had a PCN reaction occurring within the last 10 years: unknown If all of the above answers are "NO", then may proceed with Cephalosporin use.    Egg-Derived Products     GI symptoms   Other     Tree Nuts GI symptoms and bolbus feeling in throat   Oxycodone  Hives   Tape Other (See Comments)    Tape on eyes burned after being removed after last anesthesia   Family History  Problem Relation Age of Onset   Hypertension Mother    Diabetes Mother    Peripheral vascular disease Mother    Hypertension Father    Diabetes Father    Mental illness Father        bipolar   Heart attack Paternal Grandfather    Heart disease Paternal Grandfather    Colon cancer Other    Breast cancer Maternal Aunt    Breast cancer Maternal Grandmother    Anesthesia problems Neg Hx    Hypotension Neg Hx    Malignant hyperthermia Neg Hx    Pseudochol deficiency Neg Hx      Current Outpatient Medications (Cardiovascular):    nitroGLYCERIN (NITRO-DUR) 0.2 mg/hr patch, Apply 1/4 of a patch to skin once daily.  Current Outpatient Medications (Respiratory):    albuterol (PROVENTIL HFA;VENTOLIN HFA) 108 (90 BASE) MCG/ACT inhaler, Inhale 2 puffs into the lungs every 6 (six) hours as needed for wheezing or shortness of breath.   Current Outpatient Medications (Analgesics):    ketorolac  (TORADOL ) 10 MG tablet, Take 1 tablet (10 mg total) by mouth every 8 (eight) hours as needed for moderate pain (or stent discomfort post-operatively).   Current Outpatient Medications (Other):    amphetamine-dextroamphetamine (ADDERALL) 10 MG tablet, Take 10 mg by  mouth daily with breakfast. No longer on hold as of 05/25/22   Ashwagandha 500 MG CAPS, Take 500 mg by mouth daily.   Cholecalciferol 50 MCG (2000 UT) CAPS, Take 2,000 Units by mouth daily.   MAGNESIUM GLUCONATE PO, Take 500 mg by mouth daily.   phenazopyridine (PYRIDIUM) 95 MG tablet, Take 190 mg by mouth 3 (three) times daily.   tamsulosin  (FLOMAX ) 0.4 MG CAPS capsule, Take 0.4 mg by mouth daily.   Reviewed prior external information including notes and imaging from  primary care provider As well as notes that were available from care everywhere and other healthcare systems.  Past medical history, social,  surgical and family history all reviewed in electronic medical record.  No pertanent information unless stated regarding to the chief complaint.   Review of Systems:  No headache, visual changes, nausea, vomiting, diarrhea, constipation, dizziness, abdominal pain, skin rash, fevers, chills, night sweats, weight loss, swollen lymph nodes, body aches, joint swelling, chest pain, shortness of breath, mood changes. POSITIVE muscle aches  Objective  Blood pressure 110/80, pulse 90, height 5\' 3"  (1.6 m), weight 153 lb (69.4 kg), SpO2 98%, unknown if currently breastfeeding.   General: No apparent distress alert and oriented x3 mood and affect normal, dressed appropriately.  HEENT: Pupils equal, extraocular movements intact  Respiratory: Patient's speak in full sentences and does not appear short of breath  Cardiovascular: No lower extremity edema, non tender, no erythema  Right shoulder exam shows patient does have mild discomfort with Speed and Jurgenson's.  No defect in the bicep belly noted.  Limited muscular skeletal ultrasound was performed and interpreted by [name] Huang, M  Limited ultrasound of the bicep tendon junction distally does have what appears to be some chronic changes with some scar tissue formation noted.  Some mild increase in neovascularization in the  area.  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  This included exercises focusing on stretching, strengthening, with significant focus on eccentric aspects.   Long term goals include an improvement in range of motion, strength, endurance as well as avoiding reinjury. Patient's frequency would include in 1-2 times a day, 3-5 times a week for a duration of 6-12 weeks.  Exercises that included:  Basic scapular stabilization to include adduction and depression of scapula Scaption, focusing on proper movement and good control Internal and External rotation utilizing a theraband, with elbow tucked at side entire time Rows with theraband  Proper technique shown and discussed handout in great detail with ATC.  All questions were discussed and answered.     Impression and Recommendations:     The above documentation has been reviewed and is accurate and complete [name] Mcnair M Tommy Goostree, DO

## 2023-09-23 ENCOUNTER — Encounter: Payer: Self-pay | Admitting: Family Medicine

## 2023-09-23 ENCOUNTER — Ambulatory Visit (INDEPENDENT_AMBULATORY_CARE_PROVIDER_SITE_OTHER): Payer: PRIVATE HEALTH INSURANCE | Admitting: Family Medicine

## 2023-09-23 ENCOUNTER — Other Ambulatory Visit: Payer: Self-pay

## 2023-09-23 VITALS — BP 110/80 | HR 90 | Ht 63.0 in | Wt 153.0 lb

## 2023-09-23 DIAGNOSIS — M79601 Pain in right arm: Secondary | ICD-10-CM | POA: Insufficient documentation

## 2023-09-23 MED ORDER — NITROGLYCERIN 0.2 MG/HR TD PT24: MEDICATED_PATCH | TRANSDERMAL | 0 refills | Status: DC

## 2023-09-23 NOTE — Assessment & Plan Note (Signed)
 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  Seems to be fairly chronic.  Will try nitroglycerin patches to aid in healing, discussed potential side effects including headaches.  Patient is understanding and will discontinue if it occurs.  Discussed with patient that icing regimen and home exercises, which activities to do and which ones to avoid.  Increase activity slowly.  Follow-up again in 6 to 8 weeks.  Discussed arm compression sleeve as well.

## 2023-09-23 NOTE — Patient Instructions (Signed)
 Compression sleeve with activity Nitroglycerin patches Nitroglycerin Protocol   Apply 1/4 nitroglycerin patch to affected area daily.  Change position of patch within the affected area every 24 hours.  You may experience a headache during the first 1-2 weeks of using the patch, these should subside.  If you experience headaches after beginning nitroglycerin patch treatment, you may take your preferred over the counter pain reliever.  Another side effect of the nitroglycerin patch is skin irritation or rash related to patch adhesive.  Please notify our office if you develop more severe headaches or rash, and stop the patch.  Tendon healing with nitroglycerin patch may require 12 to 24 weeks depending on the extent of injury.  Men should not use if taking Viagra, Cialis, or Levitra.   Do not use if you have migraines or rosacea. Ice after activity No overhand lifting See me in 6-8 weeks

## 2023-10-14 ENCOUNTER — Ambulatory Visit: Payer: Self-pay | Admitting: Family Medicine

## 2023-11-08 NOTE — Progress Notes (Unsigned)
 [facility] [address] [place] [address] Phone: [phone] Subjective:   [name] Huang, am serving as a scribe for Dr. Arthea Claudene.  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  Seems to be fairly chronic.  Will try nitroglycerin  patches to aid in healing, discussed potential side effects including headaches.  Patient is understanding and will discontinue if it occurs.  Discussed with patient that icing regimen and home exercises, which activities to do and which ones to avoid.  Increase activity slowly.  Follow-up again in 6 to 8 weeks.  Discussed arm compression sleeve as well      Update 11/09/2023 [name] Huang is a 48 y.o. female coming in with complaint of R arm pain.  Patient was seen previously and what had what appeared to be more of a brachial radialis injury.  Was to start nitroglycerin  patches as well as home exercises.  Patient states that she has some mild pain but is trying to not perform any overhead motions.      Past Medical History:  Diagnosis Date   Abnormal colonoscopy    precancerous cells removed   Anemia    Asthma    Carpal tunnel syndrome    History of kidney stones    IBS (irritable bowel syndrome)    Kidney stone    Mild hyperemesis gravidarum, antepartum    Poison ivy    PP care - 1C/S 5/21 09/23/2011   PUPPP (pruritic urticarial papules and plaques of pregnancy)    Past Surgical History:  Procedure Laterality Date   ABDOMINOPLASTY     BREAST REDUCTION SURGERY  09/2021   CESAREAN SECTION  09/22/2011   Procedure: CESAREAN SECTION;  Surgeon: Charlie JINNY Flowers, MD;  Location: [facility];  Service: Gynecology;  Laterality: N/A;   CESAREAN SECTION N/A 04/04/2013   Procedure: Repeat CESAREAN SECTION;   Surgeon: Charlie JINNY Flowers, MD;  Location: [facility];  Service: Obstetrics;  Laterality: N/A;  EDD: 04/05/13   CESAREAN SECTION N/A 06/21/2015   Procedure: Repeat CESAREAN SECTION;  Surgeon: Charlie Flowers, MD;  Location: [facility];  Service: Obstetrics;  Laterality: N/A;  EDD: 06/28/15    COLONOSCOPY     CYSTOSCOPY WITH RETROGRADE PYELOGRAM, URETEROSCOPY AND STENT PLACEMENT Right 05/13/2022   Procedure: CYSTOSCOPY WITH RETROGRADE PYELOGRAM, URETEROSCOPY AND STENT PLACEMENT; FIRST STAGE;  Surgeon: Alvaro Hummer, MD;  Location: [facility];  Service: Urology;  Laterality: Right;  75 MINS   CYSTOSCOPY WITH RETROGRADE PYELOGRAM, URETEROSCOPY AND STENT PLACEMENT Right 06/05/2022   Procedure: SECOND STAGE CYSTOSCOPY WITH RETROGRADE PYELOGRAM, URETEROSCOPY AND STENT EXCHANGE;  Surgeon: Alvaro Hummer, MD;  Location: [facility];  Service: Urology;  Laterality: Right;  90 MINS   HOLMIUM LASER APPLICATION Right 05/13/2022   Procedure: HOLMIUM LASER APPLICATION;  Surgeon: Alvaro Hummer, MD;  Location: [facility];  Service: Urology;  Laterality: Right;   HOLMIUM LASER APPLICATION Right 06/05/2022   Procedure: HOLMIUM LASER APPLICATION;  Surgeon: Alvaro Hummer, MD;  Location: [facility];  Service: Urology;  Laterality: Right;   ROOT CANAL     WISDOM TOOTH EXTRACTION     Social History   Socioeconomic History   Marital status: Divorced    Spouse name: Not on file   Number of children: Not on file   Years  of education: Not on file   Highest education level: Not on file  Occupational History   Not on file  Tobacco Use   Smoking status: Former    Current packs/day: 0.00    Average packs/day: 0.5 packs/day for 6.0 years (3.0 ttl pk-yrs)    Types: Cigarettes    Start date: 11/20/1998    Quit date: 11/19/2004    Years since quitting: 18.9   Smokeless tobacco: Never  Vaping Use   Vaping status: Never Used  Substance and Sexual Activity   Alcohol use: Yes    Comment: twice monthly   Drug use: No   Sexual activity: Yes   Other Topics Concern   Not on file  Social History Narrative   Not on file   Social Drivers of Health   Financial Resource Strain: Not on file  Food Insecurity: Not on file  Transportation Needs: Not on file  Physical Activity: Not on file  Stress: Not on file  Social Connections: Unknown (03/23/2022)   Received from [facility]   Social Network    Social Network: Not on file   Allergies  Allergen Reactions   Chlorhexidine  Itching and Rash    Red and itching after using CHG wipes and shaving   Amoxicillin Hives    Has patient had a PCN reaction causing immediate rash, facial/tongue/throat swelling, SOB or lightheadedness with hypotension: unknown Has patient had a PCN reaction causing severe rash involving mucus membranes or skin necrosis: unknown Has patient had a PCN reaction that required hospitalization: unknown Has patient had a PCN reaction occurring within the last 10 years: unknown If all of the above answers are NO, then may proceed with Cephalosporin use.    Egg-Derived Products     GI symptoms   Other     Tree Nuts GI symptoms and bolbus feeling in throat   Oxycodone  Hives   Tape Other (See Comments)    Tape on eyes burned after being removed after last anesthesia   Family History  Problem Relation Age of Onset   Hypertension Mother    Diabetes Mother    Peripheral vascular disease Mother    Hypertension Father    Diabetes Father    Mental illness Father        bipolar   Heart attack Paternal Grandfather    Heart disease Paternal Grandfather    Colon cancer Other    Breast cancer Maternal Aunt    Breast cancer Maternal Grandmother    Anesthesia problems Neg Hx    Hypotension Neg Hx    Malignant hyperthermia Neg Hx    Pseudochol deficiency Neg Hx      Current Outpatient Medications (Cardiovascular):    nitroGLYCERIN  (NITRO-DUR ) 0.2 mg/hr patch, Apply 1/4 of a patch to skin once daily.  Current Outpatient Medications (Respiratory):     albuterol (PROVENTIL HFA;VENTOLIN HFA) 108 (90 BASE) MCG/ACT inhaler, Inhale 2 puffs into the lungs every 6 (six) hours as needed for wheezing or shortness of breath.   Current Outpatient Medications (Analgesics):    ketorolac  (TORADOL ) 10 MG tablet, Take 1 tablet (10 mg total) by mouth every 8 (eight) hours as needed for moderate pain (or stent discomfort post-operatively).   Current Outpatient Medications (Other):    amphetamine-dextroamphetamine (ADDERALL) 10 MG tablet, Take 10 mg by mouth daily with breakfast. No longer on hold as of 05/25/22   Ashwagandha 500 MG CAPS, Take 500 mg by mouth daily.   Cholecalciferol 50 MCG (2000 UT) CAPS, Take 2,000  Units by mouth daily.   MAGNESIUM GLUCONATE PO, Take 500 mg by mouth daily.   phenazopyridine (PYRIDIUM) 95 MG tablet, Take 190 mg by mouth 3 (three) times daily.   tamsulosin  (FLOMAX ) 0.4 MG CAPS capsule, Take 0.4 mg by mouth daily.    Review of Systems:  No headache, visual changes, nausea, vomiting, diarrhea, constipation, dizziness, abdominal pain, skin rash, fevers, chills, night sweats, weight loss, swollen lymph nodes, body aches, joint swelling, chest pain, shortness of breath, mood changes. POSITIVE muscle aches, fatigue  Objective  Blood pressure 122/82, pulse 66, height 5' 3 (1.6 m), weight 155 lb (70.3 kg), SpO2 96%, unknown if currently breastfeeding.   General: No apparent distress alert and oriented x3 mood and affect normal, dressed appropriately.  HEENT: Pupils equal, extraocular movements intact  Respiratory: Patient's speak in full sentences and does not appear short of breath  Cardiovascular: No lower extremity edema, non tender, no erythema  Right arm does have improvement in range of motion.  Improvement in strength noted.  Still some pain with supination.   Limited muscular skeletal ultrasound was performed and interpreted by CLAUDENE HUSSAR, M  Limited ultrasound shows the patient does have some hypoechoic changes  still noted of the brachial radialis but significant improvement from previous exam. Impression and Recommendations:    The above documentation has been reviewed and is accurate and complete [name] Huang M Jamarie Joplin, DO

## 2023-11-09 ENCOUNTER — Encounter: Payer: Self-pay | Admitting: Family Medicine

## 2023-11-09 ENCOUNTER — Other Ambulatory Visit: Payer: Self-pay

## 2023-11-09 ENCOUNTER — Ambulatory Visit (INDEPENDENT_AMBULATORY_CARE_PROVIDER_SITE_OTHER): Payer: PRIVATE HEALTH INSURANCE | Admitting: Family Medicine

## 2023-11-09 VITALS — BP 122/82 | HR 66 | Ht 63.0 in | Wt 155.0 lb

## 2023-11-09 DIAGNOSIS — M79601 Pain in right arm: Secondary | ICD-10-CM

## 2023-11-09 DIAGNOSIS — D509 Iron deficiency anemia, unspecified: Secondary | ICD-10-CM | POA: Insufficient documentation

## 2023-11-09 NOTE — Patient Instructions (Signed)
 Arm is looking well Please send other labs Iron infusion Take Vit C or mulitvitamin with your iron Ok to increase activity Let's check back in 2-3 months

## 2023-11-09 NOTE — Assessment & Plan Note (Signed)
 Significant improvement about 80% better at this time.  Ultrasound does not show any of the tearing noted.  Seems to be making improvement.  Follow-up again in 2 months able to start increasing activity as tolerated

## 2023-11-09 NOTE — Assessment & Plan Note (Signed)
 Patient has made mild improvement with her ferritin from 4-16 with 4 months of oral supplementation.  I would like to do a transfusion for further evaluation.  Will order it and see how patient responds.  Do not know if this is all of her pain and fatigue but I do think it is worthwhile to see.  Follow-up again 4 weeks after the infusion to further evaluate

## 2023-11-15 ENCOUNTER — Other Ambulatory Visit: Payer: Self-pay

## 2023-11-15 DIAGNOSIS — D509 Iron deficiency anemia, unspecified: Secondary | ICD-10-CM

## 2023-11-15 NOTE — Progress Notes (Signed)
 Claudene Arthea HERO, DO  Mardy Leotis RAMAN, CMA We can order an iron panel, CBC with differential, as well as a ferritin level please       Previous Messages    ----- Message ----- From: Mardy Leotis RAMAN, CMA Sent: 11/15/2023   8:09 AM EDT To: Arthea HERO Claudene, DO; Joesph JONELLE Mort [place] *   ----- Message ----- From: Elvin Bruno JONELLE, CPhT Sent: 11/12/2023   1:13 PM EDT To: Arthea HERO Claudene, DO; Leotis RAMAN Mardy, CMA  This patients insurance is asking for a Ferritin level and iron labs. I do not see updated labs for her. Is she getting labs soon or can you get those updated labs? Insurance will not approved without them. Thank you, Krista

## 2023-11-19 ENCOUNTER — Telehealth: Payer: Self-pay

## 2023-11-19 NOTE — Telephone Encounter (Signed)
 Dr. Claudene and [place], patient will be scheduled as soon as possible. Also, you can cancel the labs that were ordered for her unless you need them done. I found the document the patient put in mychart with her lab information after Dr. Claudene informed me it was there. Thank you!  Auth Submission: APPROVED Site of care: Site of care: [facility] Payer: Key benefit administrators/[employer] Medication & CPT/J Code(s) submitted: Feraheme (ferumoxytol) [id number] Diagnosis Code:  Route of submission (phone, fax, portal): fax Phone # Fax # [phone]  Auth type: Buy/Bill PB Units/visits requested: 510mg  x 2 doses Reference number: [id number] Approval from: 11/19/23 to 11/18/24

## 2023-11-22 NOTE — Telephone Encounter (Signed)
 Noted

## 2023-11-23 ENCOUNTER — Ambulatory Visit (INDEPENDENT_AMBULATORY_CARE_PROVIDER_SITE_OTHER): Payer: PRIVATE HEALTH INSURANCE

## 2023-11-23 VITALS — BP 127/85 | HR 66 | Temp 98.3°F | Resp 12 | Ht 62.0 in | Wt 156.0 lb

## 2023-11-23 DIAGNOSIS — D509 Iron deficiency anemia, unspecified: Secondary | ICD-10-CM

## 2023-11-23 MED ORDER — SODIUM CHLORIDE 0.9 % IV SOLN
510.0000 mg | Freq: Once | INTRAVENOUS | Status: AC
  Administered 2023-11-23: 510 mg via INTRAVENOUS
  Filled 2023-11-23 (×2): qty 17

## 2023-11-23 NOTE — Progress Notes (Signed)
 Diagnosis: Iron Deficiency Anemia  Provider:  Mannam, Praveen MD  Procedure: IV Infusion  IV Type: Peripheral, IV Location: R Antecubital  Feraheme (Ferumoxytol ), Dose: 510 mg  Infusion Start Time: 1542  Infusion Stop Time: 1558  Post Infusion IV Care: Observation period completed and Peripheral IV Discontinued. Patient experienced symptoms at completion of infusion. See below.  Discharge: Condition: Stable, Destination: Home . AVS Provided  Performed by:  Rocky FORBES Sar, RN   At [phone], when RN stopped infusion, patient complained of onset of symptoms including upper back pain, change in voice and chest pain described as heartburn in center of chest. Vital signs stable. Change in voice described as huskiness with patient feeling the need to try to clear throat. Denied difficulty breathing. Patient stated chest pain cycled from 2/10 to then becoming more constrictive and increasing to a 4-5/10. Ordering provider, Dr. Arthea Sharps DO, notified via secure message at 1605. Provider responded at 1608 and recommended continued monitoring. Patient monitored until 1636. VSS throughout. After approximately 30 minutes, patient stated symptoms were improving. Stated back pain had resolved. Stated pain in chest was still cyclical, but that she was no longer having any pain between cycles, and the episodes of pain were getting shorter and milder. Dr. Sharps updated. Per Dr. Sharps, ok to discharge patient with instructions that she could take OTC Zyrtec at home. Second Feraheme appointment cancelled per ordering provider, and patient aware. Educated patient to seek care at the Emergency Department for new onset/worsening symptoms. Patient verbalized understanding and agreement.   Rocky FORBES Sar, RN

## 2023-11-30 ENCOUNTER — Ambulatory Visit: Payer: PRIVATE HEALTH INSURANCE

## 2023-12-23 ENCOUNTER — Encounter: Payer: Self-pay | Admitting: Family Medicine

## 2023-12-24 ENCOUNTER — Other Ambulatory Visit: Payer: Self-pay

## 2023-12-24 ENCOUNTER — Other Ambulatory Visit: Payer: Self-pay | Admitting: Gastroenterology

## 2023-12-24 NOTE — Progress Notes (Signed)
 Order placed for iron infusion with [facility].   Order confirmed with Dr. Claudene for Venofer.

## 2023-12-27 ENCOUNTER — Telehealth: Payer: Self-pay | Admitting: Pharmacy Technician

## 2023-12-27 NOTE — Telephone Encounter (Addendum)
 Dr. Claudene,  Patient has been approved for Venofer  and will be scheduled as soon as possible.  Auth Submission: APPROVED Site of care: Site of care: [facility] Payer: [employer] - [employer] BENEFITS Medication & CPT/J Code(s) submitted: Venofer  (Iron  Sucrose) J1756 Diagnosis Code: D50.9 Route of submission (phone, fax, portal):  Phone # Fax # Auth type: Buy/Bill PB Units/visits requested: X5 DOSES Reference number:  Approval from: 12/27/23 to 03/28/24

## 2023-12-31 ENCOUNTER — Ambulatory Visit (INDEPENDENT_AMBULATORY_CARE_PROVIDER_SITE_OTHER): Payer: PRIVATE HEALTH INSURANCE

## 2023-12-31 VITALS — BP 117/78 | HR 61 | Temp 99.0°F | Resp 14 | Ht 62.0 in | Wt 155.0 lb

## 2023-12-31 DIAGNOSIS — D509 Iron deficiency anemia, unspecified: Secondary | ICD-10-CM

## 2023-12-31 MED ORDER — SODIUM CHLORIDE 0.9 % IV BOLUS
250.0000 mL | Freq: Once | INTRAVENOUS | Status: AC
  Administered 2023-12-31: 250 mL via INTRAVENOUS
  Filled 2023-12-31: qty 250

## 2023-12-31 MED ORDER — IRON SUCROSE 20 MG/ML IV SOLN
200.0000 mg | Freq: Once | INTRAVENOUS | Status: AC
  Administered 2023-12-31: 200 mg via INTRAVENOUS
  Filled 2023-12-31: qty 10

## 2023-12-31 NOTE — Progress Notes (Signed)
 Diagnosis: Iron Deficiency Anemia  Provider:  Chilton Greathouse MD  Procedure: IV Push  IV Type: Peripheral, IV Location: R Antecubital  Venofer (Iron Sucrose), Dose: 200 mg  Post Infusion IV Care: Observation period completed and Peripheral IV Discontinued  Discharge: Condition: Good, Destination: Home . AVS Declined  Performed by:  Rico Ala, LPN

## 2023-12-31 NOTE — Patient Instructions (Signed)

## 2024-01-04 ENCOUNTER — Ambulatory Visit (INDEPENDENT_AMBULATORY_CARE_PROVIDER_SITE_OTHER): Payer: PRIVATE HEALTH INSURANCE

## 2024-01-04 VITALS — BP 109/64 | HR 58 | Temp 98.8°F | Resp 16 | Ht 62.0 in | Wt 156.6 lb

## 2024-01-04 DIAGNOSIS — D509 Iron deficiency anemia, unspecified: Secondary | ICD-10-CM

## 2024-01-04 MED ORDER — IRON SUCROSE 20 MG/ML IV SOLN
200.0000 mg | Freq: Once | INTRAVENOUS | Status: AC
  Administered 2024-01-04: 200 mg via INTRAVENOUS
  Filled 2024-01-04: qty 10

## 2024-01-04 MED ORDER — SODIUM CHLORIDE 0.9 % IV BOLUS
250.0000 mL | Freq: Once | INTRAVENOUS | Status: DC
  Filled 2024-01-04: qty 250

## 2024-01-04 NOTE — Progress Notes (Signed)
 Diagnosis: Iron  Deficiency Anemia  Provider:  Praveen Mannam MD  Procedure: IV Push  IV Type: Peripheral, IV Location: R Antecubital  Venofer  (Iron  Sucrose), Dose: 200 mg  Post Infusion IV Care: Patient declined observation and Peripheral IV Discontinued  Discharge: Condition: Good, Destination: Home . AVS Declined  Performed by:  Maximiano JONELLE Pouch, LPN

## 2024-01-06 ENCOUNTER — Ambulatory Visit: Payer: PRIVATE HEALTH INSURANCE

## 2024-01-06 VITALS — BP 119/87 | HR 73 | Temp 97.6°F | Resp 16 | Ht 62.0 in | Wt 153.0 lb

## 2024-01-06 DIAGNOSIS — D509 Iron deficiency anemia, unspecified: Secondary | ICD-10-CM

## 2024-01-06 MED ORDER — IRON SUCROSE 20 MG/ML IV SOLN
200.0000 mg | Freq: Once | INTRAVENOUS | Status: AC
  Administered 2024-01-06: 200 mg via INTRAVENOUS
  Filled 2024-01-06: qty 10

## 2024-01-06 MED ORDER — SODIUM CHLORIDE 0.9 % IV BOLUS
250.0000 mL | Freq: Once | INTRAVENOUS | Status: AC
  Administered 2024-01-06: 250 mL via INTRAVENOUS
  Filled 2024-01-06: qty 250

## 2024-01-06 NOTE — Progress Notes (Signed)
 Diagnosis: Iron Deficiency Anemia  Provider:  Chilton Greathouse MD  Procedure: IV Push  IV Type: Peripheral, IV Location: L Forearm  Venofer (Iron Sucrose), Dose: 200 mg  Post Infusion IV Care: Patient declined observation and Peripheral IV Discontinued  Discharge: Condition: Good, Destination: Home . AVS Declined  Performed by:  Adriana Mccallum, RN

## 2024-01-10 ENCOUNTER — Ambulatory Visit (INDEPENDENT_AMBULATORY_CARE_PROVIDER_SITE_OTHER): Payer: PRIVATE HEALTH INSURANCE

## 2024-01-10 VITALS — BP 108/71 | HR 69 | Temp 98.7°F | Resp 16 | Ht 62.0 in | Wt 155.2 lb

## 2024-01-10 DIAGNOSIS — D509 Iron deficiency anemia, unspecified: Secondary | ICD-10-CM | POA: Diagnosis not present

## 2024-01-10 MED ORDER — IRON SUCROSE 20 MG/ML IV SOLN
200.0000 mg | Freq: Once | INTRAVENOUS | Status: AC
  Administered 2024-01-10: 200 mg via INTRAVENOUS
  Filled 2024-01-10: qty 10

## 2024-01-10 MED ORDER — SODIUM CHLORIDE 0.9 % IV BOLUS
250.0000 mL | Freq: Once | INTRAVENOUS | Status: AC
  Administered 2024-01-10: 250 mL via INTRAVENOUS
  Filled 2024-01-10: qty 250

## 2024-01-10 NOTE — Progress Notes (Signed)
 Diagnosis: Iron Deficiency Anemia  Provider:  Chilton Greathouse MD  Procedure: IV Push  IV Type: Peripheral, IV Location: L Antecubital  Venofer (Iron Sucrose), Dose: 200 mg  Post Infusion IV Care: Patient declined observation and Peripheral IV Discontinued  Discharge: Condition: Good, Destination: Home . AVS Declined  Performed by:  Loney Hering, LPN

## 2024-01-13 ENCOUNTER — Ambulatory Visit (INDEPENDENT_AMBULATORY_CARE_PROVIDER_SITE_OTHER): Payer: PRIVATE HEALTH INSURANCE

## 2024-01-13 VITALS — BP 120/79 | HR 66 | Temp 98.3°F | Resp 16 | Ht 62.0 in | Wt 148.6 lb

## 2024-01-13 DIAGNOSIS — D509 Iron deficiency anemia, unspecified: Secondary | ICD-10-CM | POA: Diagnosis not present

## 2024-01-13 MED ORDER — IRON SUCROSE 20 MG/ML IV SOLN
200.0000 mg | Freq: Once | INTRAVENOUS | Status: AC
  Administered 2024-01-13: 200 mg via INTRAVENOUS
  Filled 2024-01-13: qty 10

## 2024-01-13 MED ORDER — SODIUM CHLORIDE 0.9 % IV BOLUS
250.0000 mL | Freq: Once | INTRAVENOUS | Status: AC
  Administered 2024-01-13: 250 mL via INTRAVENOUS
  Filled 2024-01-13: qty 250

## 2024-01-13 NOTE — Progress Notes (Signed)
 Diagnosis: Iron  Deficiency Anemia  Provider:  Praveen Mannam MD  Procedure: IV Push  IV Type: Peripheral, IV Location: R Forearm  Venofer  (Iron  Sucrose), Dose: 200 mg  Post Infusion IV Care: Peripheral IV Discontinued  Discharge: Condition: Good, Destination: Home . AVS Declined  Performed by:  Eleanor DELENA Bloch, RN

## 2024-01-20 NOTE — Progress Notes (Signed)
 [facility] [address] [place] [address] Phone: [phone] Subjective:   LILLETTE Berwyn Posey, am serving as a scribe for Dr. Arthea Claudene.  I'm seeing this patient by the request  of:  [facility] [place]  CC: right arm pain   YEP:Dlagzrupcz  11/09/2023 Significant improvement about 80% better at this time. Ultrasound does not show any of the tearing noted. Seems to be making improvement. Follow-up again in 2 months able to start increasing activity as tolerated   Patient has made mild improvement with her ferritin from 4-16 with 4 months of oral supplementation.  I would like to do a transfusion for further evaluation.  Will order it and see how patient responds.  Do not know if this is all of her pain and fatigue but I do think it is worthwhile to see.  Follow-up again 4 weeks after the infusion to further evaluate     Update 01/21/2024 Victora Irby is a 48 y.o. female coming in with complaint of R arm pain. Patient states that she is doing well. Able to do strength training without pian.   Had 6 iron  infusions. Had reaction to iron  during first round. Patient states that she has more energy but is still tired.   Patient is also having B knee pain. Started running progression and seems to have pain in anterior knee across patella. More pain with inclines. Pain is minimal when she does have pain: 3/10. Training for half marathon in November.   Had fe anemia as well, tranfusion completed 9/11    Past Medical History:  Diagnosis Date   Abnormal colonoscopy    precancerous cells removed   Anemia    Asthma    Carpal tunnel syndrome    History of kidney stones    IBS (irritable bowel syndrome)    Kidney stone    Mild hyperemesis gravidarum, antepartum    Poison ivy    PP care - 1C/S 5/21 09/23/2011   PUPPP (pruritic urticarial papules and plaques of pregnancy)    Past Surgical History:  Procedure Laterality Date    ABDOMINOPLASTY     BREAST REDUCTION SURGERY  09/2021   CESAREAN SECTION  09/22/2011   Procedure: CESAREAN SECTION;  Surgeon: Charlie JINNY Flowers, MD;  Location: [facility];  Service: Gynecology;  Laterality: N/A;   CESAREAN SECTION N/A 04/04/2013   Procedure: Repeat CESAREAN SECTION;  Surgeon: Charlie JINNY Flowers, MD;  Location: [facility];  Service: Obstetrics;  Laterality: N/A;  EDD: 04/05/13   CESAREAN SECTION N/A 06/21/2015   Procedure: Repeat CESAREAN SECTION;  Surgeon: Charlie Flowers, MD;  Location: [facility];  Service: Obstetrics;  Laterality: N/A;  EDD: 06/28/15    COLONOSCOPY     CYSTOSCOPY WITH RETROGRADE PYELOGRAM, URETEROSCOPY AND STENT PLACEMENT Right 05/13/2022   Procedure: CYSTOSCOPY WITH RETROGRADE PYELOGRAM, URETEROSCOPY AND STENT PLACEMENT; FIRST STAGE;  Surgeon: Alvaro Hummer, MD;  Location: [facility];  Service: Urology;  Laterality: Right;  75 MINS   CYSTOSCOPY WITH RETROGRADE PYELOGRAM, URETEROSCOPY AND STENT PLACEMENT Right 06/05/2022   Procedure: SECOND STAGE CYSTOSCOPY WITH RETROGRADE PYELOGRAM, URETEROSCOPY AND STENT EXCHANGE;  Surgeon: Alvaro Hummer, MD;  Location: [facility];  Service: Urology;  Laterality: Right;  90 MINS   HOLMIUM LASER APPLICATION Right 05/13/2022   Procedure: HOLMIUM LASER APPLICATION;  Surgeon: Alvaro Hummer, MD;  Location: [facility];  Service: Urology;  Laterality: Right;   HOLMIUM LASER APPLICATION Right 06/05/2022   Procedure: HOLMIUM LASER APPLICATION;  Surgeon:  Alvaro Hummer, MD;  Location: [facility];  Service: Urology;  Laterality: Right;   ROOT CANAL     WISDOM TOOTH EXTRACTION     Social History   Socioeconomic History   Marital status: Divorced    Spouse name: Not on file   Number of children: Not on file   Years of education: Not on file   Highest education level: Not on file  Occupational History   Not on file  Tobacco Use   Smoking status: Former    Current packs/day: 0.00    Average packs/day: 0.5 packs/day for 6.0 years (3.0 ttl pk-yrs)    Types:  Cigarettes    Start date: 11/20/1998    Quit date: 11/19/2004    Years since quitting: 19.1   Smokeless tobacco: Never  Vaping Use   Vaping status: Never Used  Substance and Sexual Activity   Alcohol use: Yes    Comment: twice monthly   Drug use: No   Sexual activity: Yes  Other Topics Concern   Not on file  Social History Narrative   Not on file   Social Drivers of Health   Financial Resource Strain: Not on file  Food Insecurity: Not on file  Transportation Needs: Not on file  Physical Activity: Not on file  Stress: Not on file  Social Connections: Unknown (03/23/2022)   Received from [facility]   Social Network    Social Network: Not on file   Allergies  Allergen Reactions   Chlorhexidine  Itching and Rash    Red and itching after using CHG wipes and shaving   Amoxicillin Hives    Has patient had a PCN reaction causing immediate rash, facial/tongue/throat swelling, SOB or lightheadedness with hypotension: unknown Has patient had a PCN reaction causing severe rash involving mucus membranes or skin necrosis: unknown Has patient had a PCN reaction that required hospitalization: unknown Has patient had a PCN reaction occurring within the last 10 years: unknown If all of the above answers are NO, then may proceed with Cephalosporin use.    Egg-Derived Products     GI symptoms   Feraheme [Ferumoxytol ] Other (See Comments)    Chest pain/tightness, voice change (huskiness)   Other     Tree Nuts GI symptoms and bolbus feeling in throat   Oxycodone  Hives   Tape Other (See Comments)    Tape on eyes burned after being removed after last anesthesia   Family History  Problem Relation Age of Onset   Hypertension Mother    Diabetes Mother    Peripheral vascular disease Mother    Hypertension Father    Diabetes Father    Mental illness Father        bipolar   Heart attack Paternal Grandfather    Heart disease Paternal Grandfather    Colon cancer Other    Breast  cancer Maternal Aunt    Breast cancer Maternal Grandmother    Anesthesia problems Neg Hx    Hypotension Neg Hx    Malignant hyperthermia Neg Hx    Pseudochol deficiency Neg Hx      Current Outpatient Medications (Cardiovascular):    nitroGLYCERIN  (NITRO-DUR ) 0.2 mg/hr patch, Apply 1/4 of a patch to skin once daily.  Current Outpatient Medications (Respiratory):    albuterol (PROVENTIL HFA;VENTOLIN HFA) 108 (90 BASE) MCG/ACT inhaler, Inhale 2 puffs into the lungs every 6 (six) hours as needed for wheezing or shortness of breath.   Current Outpatient Medications (Analgesics):    ketorolac  (TORADOL ) 10 MG  tablet, Take 1 tablet (10 mg total) by mouth every 8 (eight) hours as needed for moderate pain (or stent discomfort post-operatively).   Current Outpatient Medications (Other):    Ashwagandha 500 MG CAPS, Take 500 mg by mouth daily.   MAGNESIUM GLUCONATE PO, Take 500 mg by mouth daily.   amphetamine-dextroamphetamine (ADDERALL) 10 MG tablet, Take 10 mg by mouth daily with breakfast. No longer on hold as of 05/25/22   Cholecalciferol 50 MCG (2000 UT) CAPS, Take 2,000 Units by mouth daily.   phenazopyridine (PYRIDIUM) 95 MG tablet, Take 190 mg by mouth 3 (three) times daily.   tamsulosin  (FLOMAX ) 0.4 MG CAPS capsule, Take 0.4 mg by mouth daily.   Reviewed prior external information including notes and imaging from  primary care provider As well as notes that were available from care everywhere and other healthcare systems.  Past medical history, social, surgical and family history all reviewed in electronic medical record.  No pertanent information unless stated regarding to the chief complaint.   Review of Systems:  No headache, visual changes, nausea, vomiting, diarrhea, constipation, dizziness, abdominal pain, skin rash, fevers, chills, night sweats, weight loss, swollen lymph nodes, body aches, joint swelling, chest pain, shortness of breath, mood changes. POSITIVE muscle  aches  Objective  Blood pressure 120/84, pulse 70, height 5' 2 (1.575 m), weight 151 lb (68.5 kg), SpO2 99%, unknown if currently breastfeeding.   General: No apparent distress alert and oriented x3 mood and affect normal, dressed appropriately.  HEENT: Pupils equal, extraocular movements intact  Respiratory: Patient's speak in full sentences and does not appear short of breath  Cardiovascular: No lower extremity edema, non tender, no erythema  Patient does have some tightness of the lungs noted with some lateral tracking of the kneecap.  Patient does on the left calf have tightness noted.  Some limited range of motion of the left ankle noted especially with dorsiflexion.  Eversion of the ankle.  Limited muscular skeletal ultrasound was performed and interpreted by CLAUDENE HUSSAR, M  Patient does have some lateral hypoechoic changes of the patellofemoral joint noted.  No medial meniscus abnormality noted.  Some mild narrowing of the patellofemoral joint noted.    Impression and Recommendations:     The above documentation has been reviewed and is accurate and complete Fantasha Daniele M Mellony Danziger, DO

## 2024-01-24 ENCOUNTER — Encounter: Payer: Self-pay | Admitting: Family Medicine

## 2024-01-24 ENCOUNTER — Other Ambulatory Visit: Payer: Self-pay

## 2024-01-24 ENCOUNTER — Ambulatory Visit: Payer: PRIVATE HEALTH INSURANCE | Admitting: Family Medicine

## 2024-01-24 ENCOUNTER — Ambulatory Visit: Payer: PRIVATE HEALTH INSURANCE

## 2024-01-24 ENCOUNTER — Ambulatory Visit: Payer: Self-pay | Admitting: Family Medicine

## 2024-01-24 VITALS — BP 120/84 | HR 70 | Ht 62.0 in | Wt 151.0 lb

## 2024-01-24 DIAGNOSIS — M79601 Pain in right arm: Secondary | ICD-10-CM | POA: Diagnosis not present

## 2024-01-24 DIAGNOSIS — D509 Iron deficiency anemia, unspecified: Secondary | ICD-10-CM

## 2024-01-24 DIAGNOSIS — M25561 Pain in right knee: Secondary | ICD-10-CM | POA: Diagnosis not present

## 2024-01-24 DIAGNOSIS — G8929 Other chronic pain: Secondary | ICD-10-CM

## 2024-01-24 DIAGNOSIS — R5383 Other fatigue: Secondary | ICD-10-CM | POA: Diagnosis not present

## 2024-01-24 DIAGNOSIS — M25562 Pain in left knee: Secondary | ICD-10-CM

## 2024-01-24 DIAGNOSIS — S86111A Strain of other muscle(s) and tendon(s) of posterior muscle group at lower leg level, right leg, initial encounter: Secondary | ICD-10-CM

## 2024-01-24 LAB — URINALYSIS
Bilirubin Urine: NEGATIVE
Hgb urine dipstick: NEGATIVE
Ketones, ur: NEGATIVE
Leukocytes,Ua: NEGATIVE
Nitrite: NEGATIVE
Specific Gravity, Urine: 1.01 (ref 1.000–1.030)
Total Protein, Urine: NEGATIVE
Urine Glucose: NEGATIVE
Urobilinogen, UA: 0.2 (ref 0.0–1.0)
pH: 7.5 (ref 5.0–8.0)

## 2024-01-24 LAB — IBC PANEL
Iron: 144 ug/dL (ref 42–145)
Saturation Ratios: 55.3 % — ABNORMAL HIGH (ref 20.0–50.0)
TIBC: 260.4 ug/dL (ref 250.0–450.0)
Transferrin: 186 mg/dL — ABNORMAL LOW (ref 212.0–360.0)

## 2024-01-24 LAB — COMPREHENSIVE METABOLIC PANEL WITH GFR
ALT: 13 U/L (ref 0–35)
AST: 15 U/L (ref 0–37)
Albumin: 4.3 g/dL (ref 3.5–5.2)
Alkaline Phosphatase: 43 U/L (ref 39–117)
BUN: 12 mg/dL (ref 6–23)
CO2: 27 meq/L (ref 19–32)
Calcium: 8.9 mg/dL (ref 8.4–10.5)
Chloride: 106 meq/L (ref 96–112)
Creatinine, Ser: 0.77 mg/dL (ref 0.40–1.20)
GFR: 92.82 mL/min (ref >60.00)
Glucose, Bld: 88 mg/dL (ref 70–99)
Potassium: 4.4 meq/L (ref 3.5–5.1)
Sodium: 137 meq/L (ref 135–145)
Total Bilirubin: 0.7 mg/dL (ref 0.2–1.2)
Total Protein: 6.6 g/dL (ref 6.0–8.3)

## 2024-01-24 LAB — FERRITIN: Ferritin: 337.3 ng/mL — ABNORMAL HIGH (ref 10.0–291.0)

## 2024-01-24 NOTE — Assessment & Plan Note (Signed)
 Recheck iron  at this point.  Discussed icing regimen and home exercises, discussed with her continuing close to avoid.  Increase activity slowly.  Follow-up again to see how patient is doing.  May need to repeat labs at a different date.  May need to repeat infusion as well

## 2024-01-24 NOTE — Assessment & Plan Note (Signed)
 I believe patient is now compensating a little bit and using more of the lateral gastroc.  Patient also externally rotates foot.  Will send a video of her running to further evaluate.  Follow-up with me again in 6 to 8 weeks otherwise.

## 2024-01-24 NOTE — Patient Instructions (Signed)
 Keep big to forward Labs and xray today See me in 2 months

## 2024-01-26 LAB — PTH, INTACT AND CALCIUM
Calcium: 8.9 mg/dL (ref 8.6–10.2)
PTH: 36 pg/mL (ref 16–77)

## 2024-03-06 NOTE — Progress Notes (Deleted)
 [name] [address] [place] [address] Phone: [phone] Subjective:    I'm seeing this patient by the request  of:  [facility] [place]  CC:   YEP:Dlagzrupcz  01/24/2024 I believe patient is now compensating a little bit and using more of the lateral gastroc.  Patient also externally rotates foot.  Will send a video of her running to further evaluate.  Follow-up with me again in 6 to 8 weeks otherwise.     Recheck iron  at this point.  Discussed icing regimen and home exercises, discussed with her continuing close to avoid.  Increase activity slowly.  Follow-up again to see how patient is doing.  May need to repeat labs at a different date.  May need to repeat infusion as well       Update 03/08/2024 [name] Huang is a 48 y.o. female coming in with complaint of R calf, R arm and B knee pain. Patient states        Past Medical History:  Diagnosis Date   Abnormal colonoscopy    precancerous cells removed   Anemia    Asthma    Carpal tunnel syndrome    History of kidney stones    IBS (irritable bowel syndrome)    Kidney stone    Mild hyperemesis gravidarum, antepartum    Poison ivy    PP care - 1C/S 5/21 09/23/2011   PUPPP (pruritic urticarial papules and plaques of pregnancy)    Past Surgical History:  Procedure Laterality Date   ABDOMINOPLASTY     BREAST REDUCTION SURGERY  09/2021   CESAREAN SECTION  09/22/2011   Procedure: CESAREAN SECTION;  Surgeon: Charlie JINNY Flowers, MD;  Location: [facility];  Service: Gynecology;  Laterality: N/A;   CESAREAN SECTION N/A 04/04/2013   Procedure: Repeat CESAREAN SECTION;  Surgeon: Charlie JINNY Flowers, MD;  Location: [facility];  Service: Obstetrics;  Laterality: N/A;  EDD: 04/05/13   CESAREAN SECTION N/A 06/21/2015   Procedure: Repeat CESAREAN SECTION;  Surgeon: Charlie Flowers, MD;  Location: [facility];  Service: Obstetrics;  Laterality: N/A;  EDD: 06/28/15    COLONOSCOPY      CYSTOSCOPY WITH RETROGRADE PYELOGRAM, URETEROSCOPY AND STENT PLACEMENT Right 05/13/2022   Procedure: CYSTOSCOPY WITH RETROGRADE PYELOGRAM, URETEROSCOPY AND STENT PLACEMENT; FIRST STAGE;  Surgeon: Alvaro Hummer, MD;  Location: [facility];  Service: Urology;  Laterality: Right;  75 MINS   CYSTOSCOPY WITH RETROGRADE PYELOGRAM, URETEROSCOPY AND STENT PLACEMENT Right 06/05/2022   Procedure: SECOND STAGE CYSTOSCOPY WITH RETROGRADE PYELOGRAM, URETEROSCOPY AND STENT EXCHANGE;  Surgeon: Alvaro Hummer, MD;  Location: [facility];  Service: Urology;  Laterality: Right;  90 MINS   HOLMIUM LASER APPLICATION Right 05/13/2022   Procedure: HOLMIUM LASER APPLICATION;  Surgeon: Alvaro Hummer, MD;  Location: [facility];  Service: Urology;  Laterality: Right;   HOLMIUM LASER APPLICATION Right 06/05/2022   Procedure: HOLMIUM LASER APPLICATION;  Surgeon: Alvaro Hummer, MD;  Location: [facility];  Service: Urology;  Laterality: Right;   ROOT CANAL     WISDOM TOOTH EXTRACTION     Social History   Socioeconomic History   Marital status: Divorced    Spouse name: Not on file   Number of children: Not on file   Years of education: Not on file   Highest education level: Not on file  Occupational History   Not on file  Tobacco Use   Smoking status: Former    Current packs/day: 0.00    Average packs/day:  0.5 packs/day for 6.0 years (3.0 ttl pk-yrs)    Types: Cigarettes    Start date: 11/20/1998    Quit date: 11/19/2004    Years since quitting: 19.3   Smokeless tobacco: Never  Vaping Use   Vaping status: Never Used  Substance and Sexual Activity   Alcohol use: Yes    Comment: twice monthly   Drug use: No   Sexual activity: Yes  Other Topics Concern   Not on file  Social History Narrative   Not on file   Social Drivers of Health   Financial Resource Strain: Not on file  Food Insecurity: Not on file  Transportation Needs: Not on file  Physical Activity: Not on file  Stress: Not on file  Social Connections: Unknown  (03/23/2022)   Received from [facility]   Social Network    Social Network: Not on file   Allergies  Allergen Reactions   Chlorhexidine  Itching and Rash    Red and itching after using CHG wipes and shaving   Amoxicillin Hives    Has patient had a PCN reaction causing immediate rash, facial/tongue/throat swelling, SOB or lightheadedness with hypotension: unknown Has patient had a PCN reaction causing severe rash involving mucus membranes or skin necrosis: unknown Has patient had a PCN reaction that required hospitalization: unknown Has patient had a PCN reaction occurring within the last 10 years: unknown If all of the above answers are NO, then may proceed with Cephalosporin use.    Egg Protein-Containing Drug Products     GI symptoms   Feraheme [Ferumoxytol ] Other (See Comments)    Chest pain/tightness, voice change (huskiness)   Other     Tree Nuts GI symptoms and bolbus feeling in throat   Oxycodone  Hives   Tape Other (See Comments)    Tape on eyes burned after being removed after last anesthesia   Family History  Problem Relation Age of Onset   Hypertension Mother    Diabetes Mother    Peripheral vascular disease Mother    Hypertension Father    Diabetes Father    Mental illness Father        bipolar   Heart attack Paternal Grandfather    Heart disease Paternal Grandfather    Colon cancer Other    Breast cancer Maternal Aunt    Breast cancer Maternal Grandmother    Anesthesia problems Neg Hx    Hypotension Neg Hx    Malignant hyperthermia Neg Hx    Pseudochol deficiency Neg Hx      Current Outpatient Medications (Cardiovascular):    nitroGLYCERIN  (NITRO-DUR ) 0.2 mg/hr patch, Apply 1/4 of a patch to skin once daily.  Current Outpatient Medications (Respiratory):    albuterol (PROVENTIL HFA;VENTOLIN HFA) 108 (90 BASE) MCG/ACT inhaler, Inhale 2 puffs into the lungs every 6 (six) hours as needed for wheezing or shortness of breath.   Current Outpatient  Medications (Analgesics):    ketorolac  (TORADOL ) 10 MG tablet, Take 1 tablet (10 mg total) by mouth every 8 (eight) hours as needed for moderate pain (or stent discomfort post-operatively).   Current Outpatient Medications (Other):    amphetamine-dextroamphetamine (ADDERALL) 10 MG tablet, Take 10 mg by mouth daily with breakfast. No longer on hold as of 05/25/22   Ashwagandha 500 MG CAPS, Take 500 mg by mouth daily.   Cholecalciferol 50 MCG (2000 UT) CAPS, Take 2,000 Units by mouth daily.   MAGNESIUM GLUCONATE PO, Take 500 mg by mouth daily.   phenazopyridine (PYRIDIUM) 95 MG tablet, Take  190 mg by mouth 3 (three) times daily.   tamsulosin  (FLOMAX ) 0.4 MG CAPS capsule, Take 0.4 mg by mouth daily.   Reviewed prior external information including notes and imaging from  primary care provider As well as notes that were available from care everywhere and other healthcare systems.  Past medical history, social, surgical and family history all reviewed in electronic medical record.  No pertanent information unless stated regarding to the chief complaint.   Review of Systems:  No headache, visual changes, nausea, vomiting, diarrhea, constipation, dizziness, abdominal pain, skin rash, fevers, chills, night sweats, weight loss, swollen lymph nodes, body aches, joint swelling, chest pain, shortness of breath, mood changes. POSITIVE muscle aches  Objective  unknown if currently breastfeeding.   General: No apparent distress alert and oriented x3 mood and affect normal, dressed appropriately.  HEENT: Pupils equal, extraocular movements intact  Respiratory: Patient's speak in full sentences and does not appear short of breath  Cardiovascular: No lower extremity edema, non tender, no erythema      Impression and Recommendations:

## 2024-03-08 ENCOUNTER — Ambulatory Visit: Payer: PRIVATE HEALTH INSURANCE | Admitting: Family Medicine

## 2024-03-13 NOTE — Progress Notes (Unsigned)
 [facility] [address] [place] [address] Phone: [phone] Subjective:   ISusannah Gully, am serving as a scribe for Dr. Arthea Claudene.   CC: Bilateral leg pain  YEP:Dlagzrupcz  01/24/2024 I believe patient is now compensating a little bit and using more of the lateral gastroc.  Patient also externally rotates foot.  Will send a video of her running to further evaluate.  Follow-up with me again in 6 to 8 weeks otherwise.     Recheck iron  at this point.  Discussed icing regimen and home exercises, discussed with her continuing close to avoid.  Increase activity slowly.  Follow-up again to see how patient is doing.  May need to repeat labs at a different date.  May need to repeat infusion as well       Update 03/14/2024 Myrlene Riera is a 48 y.o. female coming in with complaint of R calf, R arm and B knee pain. Patient states feet and ankles doing well. Doing PT for everything. Only R knee pain at this point. Clemens about a week after last visit. Everything else is doing well.   Laboratory workup showed that patient was doing significantly better with her iron  after the infusions.   Bilateral knee x-rays did not show any significant bony abnormality. Past Medical History:  Diagnosis Date   Abnormal colonoscopy    precancerous cells removed   Anemia    Asthma    Carpal tunnel syndrome    History of kidney stones    IBS (irritable bowel syndrome)    Kidney stone    Mild hyperemesis gravidarum, antepartum    Poison ivy    PP care - 1C/S 5/21 09/23/2011   PUPPP (pruritic urticarial papules and plaques of pregnancy)    Past Surgical History:  Procedure Laterality Date   ABDOMINOPLASTY     BREAST REDUCTION SURGERY  09/2021   CESAREAN SECTION  09/22/2011   Procedure: CESAREAN SECTION;  Surgeon: Charlie JINNY Flowers, MD;  Location: [facility];  Service: Gynecology;  Laterality: N/A;   CESAREAN SECTION N/A 04/04/2013   Procedure: Repeat CESAREAN  SECTION;  Surgeon: Charlie JINNY Flowers, MD;  Location: [facility];  Service: Obstetrics;  Laterality: N/A;  EDD: 04/05/13   CESAREAN SECTION N/A 06/21/2015   Procedure: Repeat CESAREAN SECTION;  Surgeon: Charlie Flowers, MD;  Location: [facility];  Service: Obstetrics;  Laterality: N/A;  EDD: 06/28/15    COLONOSCOPY     CYSTOSCOPY WITH RETROGRADE PYELOGRAM, URETEROSCOPY AND STENT PLACEMENT Right 05/13/2022   Procedure: CYSTOSCOPY WITH RETROGRADE PYELOGRAM, URETEROSCOPY AND STENT PLACEMENT; FIRST STAGE;  Surgeon: Alvaro Hummer, MD;  Location: [facility];  Service: Urology;  Laterality: Right;  75 MINS   CYSTOSCOPY WITH RETROGRADE PYELOGRAM, URETEROSCOPY AND STENT PLACEMENT Right 06/05/2022   Procedure: SECOND STAGE CYSTOSCOPY WITH RETROGRADE PYELOGRAM, URETEROSCOPY AND STENT EXCHANGE;  Surgeon: Alvaro Hummer, MD;  Location: [facility];  Service: Urology;  Laterality: Right;  90 MINS   HOLMIUM LASER APPLICATION Right 05/13/2022   Procedure: HOLMIUM LASER APPLICATION;  Surgeon: Alvaro Hummer, MD;  Location: [facility];  Service: Urology;  Laterality: Right;   HOLMIUM LASER APPLICATION Right 06/05/2022   Procedure: HOLMIUM LASER APPLICATION;  Surgeon: Alvaro Hummer, MD;  Location: [facility];  Service: Urology;  Laterality: Right;   ROOT CANAL     WISDOM TOOTH EXTRACTION     Social History   Socioeconomic History   Marital status: Divorced    Spouse name: Not on file   Number  of children: Not on file   Years of education: Not on file   Highest education level: Not on file  Occupational History   Not on file  Tobacco Use   Smoking status: Former    Current packs/day: 0.00    Average packs/day: 0.5 packs/day for 6.0 years (3.0 ttl pk-yrs)    Types: Cigarettes    Start date: 11/20/1998    Quit date: 11/19/2004    Years since quitting: 19.3   Smokeless tobacco: Never  Vaping Use   Vaping status: Never Used  Substance and Sexual Activity   Alcohol use: Yes    Comment: twice monthly   Drug use: No   Sexual  activity: Yes  Other Topics Concern   Not on file  Social History Narrative   Not on file   Social Drivers of Health   Financial Resource Strain: Not on file  Food Insecurity: Not on file  Transportation Needs: Not on file  Physical Activity: Not on file  Stress: Not on file  Social Connections: Unknown (03/23/2022)   Received from [facility]   Social Network    Social Network: Not on file   Allergies  Allergen Reactions   Chlorhexidine  Itching and Rash    Red and itching after using CHG wipes and shaving   Amoxicillin Hives    Has patient had a PCN reaction causing immediate rash, facial/tongue/throat swelling, SOB or lightheadedness with hypotension: unknown Has patient had a PCN reaction causing severe rash involving mucus membranes or skin necrosis: unknown Has patient had a PCN reaction that required hospitalization: unknown Has patient had a PCN reaction occurring within the last 10 years: unknown If all of the above answers are NO, then may proceed with Cephalosporin use.    Egg Protein-Containing Drug Products     GI symptoms   Feraheme [Ferumoxytol ] Other (See Comments)    Chest pain/tightness, voice change (huskiness)   Other     Tree Nuts GI symptoms and bolbus feeling in throat   Oxycodone  Hives   Tape Other (See Comments)    Tape on eyes burned after being removed after last anesthesia   Family History  Problem Relation Age of Onset   Hypertension Mother    Diabetes Mother    Peripheral vascular disease Mother    Hypertension Father    Diabetes Father    Mental illness Father        bipolar   Heart attack Paternal Grandfather    Heart disease Paternal Grandfather    Colon cancer Other    Breast cancer Maternal Aunt    Breast cancer Maternal Grandmother    Anesthesia problems Neg Hx    Hypotension Neg Hx    Malignant hyperthermia Neg Hx    Pseudochol deficiency Neg Hx      Current Outpatient Medications (Cardiovascular):    nitroGLYCERIN   (NITRO-DUR ) 0.2 mg/hr patch, Apply 1/4 of a patch to skin once daily.  Current Outpatient Medications (Respiratory):    albuterol (PROVENTIL HFA;VENTOLIN HFA) 108 (90 BASE) MCG/ACT inhaler, Inhale 2 puffs into the lungs every 6 (six) hours as needed for wheezing or shortness of breath.   Current Outpatient Medications (Analgesics):    ketorolac  (TORADOL ) 10 MG tablet, Take 1 tablet (10 mg total) by mouth every 8 (eight) hours as needed for moderate pain (or stent discomfort post-operatively).   Current Outpatient Medications (Other):    amphetamine-dextroamphetamine (ADDERALL) 10 MG tablet, Take 10 mg by mouth daily with breakfast. No longer on hold  as of 05/25/22   Ashwagandha 500 MG CAPS, Take 500 mg by mouth daily.   Cholecalciferol 50 MCG (2000 UT) CAPS, Take 2,000 Units by mouth daily.   MAGNESIUM GLUCONATE PO, Take 500 mg by mouth daily.   phenazopyridine (PYRIDIUM) 95 MG tablet, Take 190 mg by mouth 3 (three) times daily.   tamsulosin  (FLOMAX ) 0.4 MG CAPS capsule, Take 0.4 mg by mouth daily.   Reviewed prior external information including notes and imaging from  primary care provider As well as notes that were available from care everywhere and other healthcare systems.  Past medical history, social, surgical and family history all reviewed in electronic medical record.  No pertanent information unless stated regarding to the chief complaint.   Review of Systems:  No headache, visual changes, nausea, vomiting, diarrhea, constipation, dizziness, abdominal pain, skin rash, fevers, chills, night sweats, weight loss, swollen lymph nodes, body aches, joint swelling, chest pain, shortness of breath, mood changes. POSITIVE muscle aches  Objective  unknown if currently breastfeeding.   General: No apparent distress alert and oriented x3 mood and affect normal, dressed appropriately.  HEENT: Pupils equal, extraocular movements intact  Respiratory: Patient's speak in full sentences and  does not appear short of breath  Cardiovascular: No lower extremity edema, non tender, no erythema  Patient's right knee does have swelling and appearance just proximal to the patella.  Good range of motion noted that maybe lacks the last 5 degrees of flexion.  Limited muscular skeletal ultrasound was performed and interpreted by CLAUDENE HUSSAR, M  Limited ultrasound that shows show the patient does have some hypoechoic changes that is just inferior to the quadricep musculature.  Seems to be more of a seroma.  Very small in nature with very mild hypoechoic changes consistent with fluid.  Does not have any communication with the knee joint itself.    Impression and Recommendations:      The above documentation has been reviewed and is accurate and complete Trice Aspinall M Nitza Schmid, DO

## 2024-03-14 ENCOUNTER — Other Ambulatory Visit: Payer: Self-pay

## 2024-03-14 ENCOUNTER — Ambulatory Visit: Payer: PRIVATE HEALTH INSURANCE | Admitting: Family Medicine

## 2024-03-14 ENCOUNTER — Encounter: Payer: Self-pay | Admitting: Family Medicine

## 2024-03-14 VITALS — BP 122/78 | HR 66 | Ht 62.0 in | Wt 149.0 lb

## 2024-03-14 DIAGNOSIS — S8011XA Contusion of right lower leg, initial encounter: Secondary | ICD-10-CM

## 2024-03-14 DIAGNOSIS — M79601 Pain in right arm: Secondary | ICD-10-CM | POA: Diagnosis not present

## 2024-03-14 DIAGNOSIS — S8001XA Contusion of right knee, initial encounter: Secondary | ICD-10-CM | POA: Diagnosis not present

## 2024-03-14 NOTE — Assessment & Plan Note (Signed)
 Knee contusion noted, discussed icing regimen and home exercises, discussed which activities to do and which ones to avoid at the moment.  Discussed compression, can increase activity as tolerated with patient's having complete stability of the knee noted.  Follow-up with me again in 2 to 3 months after the holidays to see how patient does.

## 2024-03-14 NOTE — Patient Instructions (Signed)
Good to see you See me again in 2 months 

## 2024-03-28 ENCOUNTER — Ambulatory Visit (INDEPENDENT_AMBULATORY_CARE_PROVIDER_SITE_OTHER): Payer: PRIVATE HEALTH INSURANCE | Admitting: Family Medicine

## 2024-03-28 ENCOUNTER — Ambulatory Visit: Payer: Self-pay | Admitting: Family Medicine

## 2024-03-28 ENCOUNTER — Encounter: Payer: Self-pay | Admitting: Family Medicine

## 2024-03-28 VITALS — BP 120/82 | HR 73 | Ht 62.0 in | Wt 148.0 lb

## 2024-03-28 DIAGNOSIS — D509 Iron deficiency anemia, unspecified: Secondary | ICD-10-CM | POA: Diagnosis not present

## 2024-03-28 DIAGNOSIS — R5383 Other fatigue: Secondary | ICD-10-CM

## 2024-03-28 LAB — COMPREHENSIVE METABOLIC PANEL WITH GFR
ALT: 14 U/L (ref 0–35)
AST: 15 U/L (ref 0–37)
Albumin: 4.2 g/dL (ref 3.5–5.2)
Alkaline Phosphatase: 42 U/L (ref 39–117)
BUN: 11 mg/dL (ref 6–23)
CO2: 30 meq/L (ref 19–32)
Calcium: 9.2 mg/dL (ref 8.4–10.5)
Chloride: 106 meq/L (ref 96–112)
Creatinine, Ser: 0.83 mg/dL (ref 0.40–1.20)
GFR: 84.72 mL/min (ref >60.00)
Glucose, Bld: 86 mg/dL (ref 70–99)
Potassium: 4.8 meq/L (ref 3.5–5.1)
Sodium: 140 meq/L (ref 135–145)
Total Bilirubin: 0.4 mg/dL (ref 0.2–1.2)
Total Protein: 6.4 g/dL (ref 6.0–8.3)

## 2024-03-28 LAB — CBC WITH DIFFERENTIAL/PLATELET
Basophils Absolute: 0.1 K/uL (ref 0.0–0.1)
Basophils Relative: 1 % (ref 0.0–3.0)
Eosinophils Absolute: 0.2 K/uL (ref 0.0–0.7)
Eosinophils Relative: 3.1 % (ref 0.0–5.0)
HCT: 39.7 % (ref 36.0–46.0)
Hemoglobin: 13.5 g/dL (ref 12.0–15.0)
Lymphocytes Relative: 22.7 % (ref 12.0–46.0)
Lymphs Abs: 1.4 K/uL (ref 0.7–4.0)
MCHC: 33.9 g/dL (ref 30.0–36.0)
MCV: 93.7 fl (ref 78.0–100.0)
Monocytes Absolute: 0.4 K/uL (ref 0.1–1.0)
Monocytes Relative: 6.4 % (ref 3.0–12.0)
Neutro Abs: 4 K/uL (ref 1.4–7.7)
Neutrophils Relative %: 66.8 % (ref 43.0–77.0)
Platelets: 276 K/uL (ref 150.0–400.0)
RBC: 4.24 Mil/uL (ref 3.87–5.11)
RDW: 13 % (ref 11.5–15.5)
WBC: 6 K/uL (ref 4.0–10.5)

## 2024-03-28 LAB — URINALYSIS
Bilirubin Urine: NEGATIVE
Hgb urine dipstick: NEGATIVE
Ketones, ur: NEGATIVE
Leukocytes,Ua: NEGATIVE
Nitrite: NEGATIVE
Specific Gravity, Urine: 1.02 (ref 1.000–1.030)
Total Protein, Urine: NEGATIVE
Urine Glucose: NEGATIVE
Urobilinogen, UA: 0.2 (ref 0.0–1.0)
pH: 7.5 (ref 5.0–8.0)

## 2024-03-28 LAB — CK: Total CK: 132 U/L (ref 17–177)

## 2024-03-28 NOTE — Progress Notes (Signed)
 [facility] [address] [place] [address] Phone: [phone] Subjective:   [name] Huang, am serving as a scribe for Dr. Arthea Claudene.  I'm seeing this patient by the request  of:  [facility] [place]  CC: Right arm pain, right knee pain follow up   YEP:Dlagzrupcz  [name] Huang is a 48 y.o. female coming in with complaint of L knee and calf pain since Saturday. Patient states that she stopped to catch her breath with .4 miles remaining in a half marathon. When she started to run again she developed a cramps in B legs. L leg pain persisted though. Calf, quad, and knee soreness. Having a hard time sleeping.      Past Medical History:  Diagnosis Date   Abnormal colonoscopy    precancerous cells removed   Anemia    Asthma    Carpal tunnel syndrome    History of kidney stones    IBS (irritable bowel syndrome)    Kidney stone    Mild hyperemesis gravidarum, antepartum    Poison ivy    PP care - 1C/S 5/21 09/23/2011   PUPPP (pruritic urticarial papules and plaques of pregnancy)    Past Surgical History:  Procedure Laterality Date   ABDOMINOPLASTY     BREAST REDUCTION SURGERY  09/2021   CESAREAN SECTION  09/22/2011   Procedure: CESAREAN SECTION;  Surgeon: Charlie JINNY Flowers, MD;  Location: [facility];  Service: Gynecology;  Laterality: N/A;   CESAREAN SECTION N/A 04/04/2013   Procedure: Repeat CESAREAN SECTION;  Surgeon: Charlie JINNY Flowers, MD;  Location: [facility];  Service: Obstetrics;  Laterality: N/A;  EDD: 04/05/13   CESAREAN SECTION N/A 06/21/2015   Procedure: Repeat CESAREAN SECTION;  Surgeon: Charlie Flowers, MD;  Location: [facility];  Service: Obstetrics;  Laterality: N/A;  EDD: 06/28/15    COLONOSCOPY     CYSTOSCOPY WITH RETROGRADE PYELOGRAM, URETEROSCOPY AND STENT PLACEMENT Right 05/13/2022   Procedure: CYSTOSCOPY WITH RETROGRADE PYELOGRAM, URETEROSCOPY AND STENT PLACEMENT; FIRST STAGE;  Surgeon: Alvaro Hummer, MD;   Location: [facility];  Service: Urology;  Laterality: Right;  75 MINS   CYSTOSCOPY WITH RETROGRADE PYELOGRAM, URETEROSCOPY AND STENT PLACEMENT Right 06/05/2022   Procedure: SECOND STAGE CYSTOSCOPY WITH RETROGRADE PYELOGRAM, URETEROSCOPY AND STENT EXCHANGE;  Surgeon: Alvaro Hummer, MD;  Location: [facility];  Service: Urology;  Laterality: Right;  90 MINS   HOLMIUM LASER APPLICATION Right 05/13/2022   Procedure: HOLMIUM LASER APPLICATION;  Surgeon: Alvaro Hummer, MD;  Location: [facility];  Service: Urology;  Laterality: Right;   HOLMIUM LASER APPLICATION Right 06/05/2022   Procedure: HOLMIUM LASER APPLICATION;  Surgeon: Alvaro Hummer, MD;  Location: [facility];  Service: Urology;  Laterality: Right;   ROOT CANAL     WISDOM TOOTH EXTRACTION     Social History   Socioeconomic History   Marital status: Divorced    Spouse name: Not on file   Number of children: Not on file   Years of education: Not on file   Highest education level: Not on file  Occupational History   Not on file  Tobacco Use   Smoking status: Former    Current packs/day: 0.00    Average packs/day: 0.5 packs/day for 6.0 years (3.0 ttl pk-yrs)    Types: Cigarettes    Start date: 11/20/1998    Quit date: 11/19/2004    Years since quitting: 19.3   Smokeless tobacco: Never  Vaping Use   Vaping status: Never Used  Substance and Sexual Activity   Alcohol use: Yes    Comment: twice monthly   Drug use: No   Sexual activity: Yes  Other Topics Concern   Not on file  Social History Narrative   Not on file   Social Drivers of Health   Financial Resource Strain: Not on file  Food Insecurity: Not on file  Transportation Needs: Not on file  Physical Activity: Not on file  Stress: Not on file  Social Connections: Not on file   Allergies  Allergen Reactions   Chlorhexidine  Itching and Rash    Red and itching after using CHG wipes and shaving   Amoxicillin Hives    Has patient had a PCN reaction causing immediate rash,  facial/tongue/throat swelling, SOB or lightheadedness with hypotension: unknown Has patient had a PCN reaction causing severe rash involving mucus membranes or skin necrosis: unknown Has patient had a PCN reaction that required hospitalization: unknown Has patient had a PCN reaction occurring within the last 10 years: unknown If all of the above answers are NO, then may proceed with Cephalosporin use.    Egg Protein-Containing Drug Products     GI symptoms   Feraheme [Ferumoxytol ] Other (See Comments)    Chest pain/tightness, voice change (huskiness)   Other     Tree Nuts GI symptoms and bolbus feeling in throat   Oxycodone  Hives   Tape Other (See Comments)    Tape on eyes burned after being removed after last anesthesia   Family History  Problem Relation Age of Onset   Hypertension Mother    Diabetes Mother    Peripheral vascular disease Mother    Hypertension Father    Diabetes Father    Mental illness Father        bipolar   Heart attack Paternal Grandfather    Heart disease Paternal Grandfather    Colon cancer Other    Breast cancer Maternal Aunt    Breast cancer Maternal Grandmother    Anesthesia problems Neg Hx    Hypotension Neg Hx    Malignant hyperthermia Neg Hx    Pseudochol deficiency Neg Hx      Current Outpatient Medications (Cardiovascular):    nitroGLYCERIN  (NITRO-DUR ) 0.2 mg/hr patch, Apply 1/4 of a patch to skin once daily.  Current Outpatient Medications (Respiratory):    albuterol (PROVENTIL HFA;VENTOLIN HFA) 108 (90 BASE) MCG/ACT inhaler, Inhale 2 puffs into the lungs every 6 (six) hours as needed for wheezing or shortness of breath.   Current Outpatient Medications (Analgesics):    ketorolac  (TORADOL ) 10 MG tablet, Take 1 tablet (10 mg total) by mouth every 8 (eight) hours as needed for moderate pain (or stent discomfort post-operatively).   Current Outpatient Medications (Other):    Ashwagandha 500 MG CAPS, Take 500 mg by mouth daily.    MAGNESIUM GLUCONATE PO, Take 500 mg by mouth daily.   amphetamine-dextroamphetamine (ADDERALL) 10 MG tablet, Take 10 mg by mouth daily with breakfast. No longer on hold as of 05/25/22   Cholecalciferol 50 MCG (2000 UT) CAPS, Take 2,000 Units by mouth daily.   phenazopyridine (PYRIDIUM) 95 MG tablet, Take 190 mg by mouth 3 (three) times daily.   tamsulosin  (FLOMAX ) 0.4 MG CAPS capsule, Take 0.4 mg by mouth daily.   Reviewed prior external information including notes and imaging from  primary care provider As well as notes that were available from care everywhere and other healthcare systems.  Past medical history, social, surgical and family history all reviewed in electronic  medical record.  No pertanent information unless stated regarding to the chief complaint.   Review of Systems:  No headache, visual changes, nausea, vomiting, diarrhea, constipation, dizziness, abdominal pain, skin rash, fevers, chills, night sweats, weight loss, swollen lymph nodes, body aches, joint swelling, chest pain, shortness of breath, mood changes. POSITIVE muscle aches  Objective  Blood pressure 120/82, pulse 73, height 5' 2 (1.575 m), weight 148 lb (67.1 kg), SpO2 100%, unknown if currently breastfeeding.   General: No apparent distress alert and oriented x3 mood and affect normal, dressed appropriately.  HEENT: Pupils equal, extraocular movements intact  Respiratory: Patient's speak in full sentences and does not appear short of breath  Cardiovascular: No lower extremity edema, non tender, no erythema  Right arm exam shows nothing specific. Left calf tender to palpation even to light sensation.  Seems to be out of proportion to the amount of palpation.  No swelling noted.  Hamstring mild tightness but nothing severe.  Limited muscular skeletal ultrasound was performed and interpreted by CLAUDENE HUSSAR, M  Limited ultrasound shows that patient has signs that are consistent with potential dehydration but no  true tear appreciated of the calf or the distal hamstring.  No masses seen in the popliteal area.  Trace hypoechoic changes of the knee joint itself. Impression: Possible dehydration but no muscle injury    Impression and Recommendations:    The above documentation has been reviewed and is accurate and complete Shaleah Nissley M Colbie Sliker, DO

## 2024-03-28 NOTE — Patient Instructions (Addendum)
 Labs today Iron  and dehydration  Stay active See me in 3 months

## 2024-03-28 NOTE — Assessment & Plan Note (Addendum)
 Concerned that muscle cramping is secondary to the iron  deficiency anemia.  The patient did have worsening symptoms.  Discussed icing regimen and home exercises, which activities to do and which ones to avoid.  Increasing activity slowly.  Discussed rechecking the iron  levels.  Do think dehydration is also playing a fairly significant role.  Follow-up again in 6 to 12 weeks otherwise.  No sign of any type of muscle tear at this moment.  Patient does use her A lot.  Differential does not show any significant concern for any type weakness thrombosis.  We did discuss that there is a potential for exertional compartment syndrome but patient was able to run 13 miles.  Patient will continue with some stretching otherwise and hydration until labs come back and see if that will change management.  I personally spent a total of 33 minutes in the care of the patient today including preparing to see the patient, performing a medically appropriate exam/evaluation, counseling and educating, documenting clinical information in the EHR, communicating results, and coordinating care.

## 2024-04-04 ENCOUNTER — Ambulatory Visit: Payer: PRIVATE HEALTH INSURANCE | Admitting: Sports Medicine

## 2024-04-06 ENCOUNTER — Other Ambulatory Visit: Payer: Self-pay

## 2024-04-06 DIAGNOSIS — R5383 Other fatigue: Secondary | ICD-10-CM

## 2024-04-18 ENCOUNTER — Ambulatory Visit: Payer: Self-pay | Admitting: Family Medicine

## 2024-04-18 ENCOUNTER — Other Ambulatory Visit: Payer: PRIVATE HEALTH INSURANCE

## 2024-04-18 DIAGNOSIS — R5383 Other fatigue: Secondary | ICD-10-CM

## 2024-04-18 LAB — CBC WITH DIFFERENTIAL/PLATELET
Basophils Absolute: 0 K/uL (ref 0.0–0.1)
Basophils Relative: 0.7 % (ref 0.0–3.0)
Eosinophils Absolute: 0.2 K/uL (ref 0.0–0.7)
Eosinophils Relative: 3.6 % (ref 0.0–5.0)
HCT: 41.5 % (ref 36.0–46.0)
Hemoglobin: 14.2 g/dL (ref 12.0–15.0)
Lymphocytes Relative: 18.9 % (ref 12.0–46.0)
Lymphs Abs: 1.2 K/uL (ref 0.7–4.0)
MCHC: 34.2 g/dL (ref 30.0–36.0)
MCV: 94.1 fl (ref 78.0–100.0)
Monocytes Absolute: 0.3 K/uL (ref 0.1–1.0)
Monocytes Relative: 4.3 % (ref 3.0–12.0)
Neutro Abs: 4.7 K/uL (ref 1.4–7.7)
Neutrophils Relative %: 72.5 % (ref 43.0–77.0)
Platelets: 236 K/uL (ref 150.0–400.0)
RBC: 4.41 Mil/uL (ref 3.87–5.11)
RDW: 12.7 % (ref 11.5–15.5)
WBC: 6.4 K/uL (ref 4.0–10.5)

## 2024-04-18 LAB — IBC PANEL
Iron: 126 ug/dL (ref 42–145)
Saturation Ratios: 35.3 % (ref 20.0–50.0)
TIBC: 357 ug/dL (ref 250.0–450.0)
Transferrin: 255 mg/dL (ref 212.0–360.0)

## 2024-04-18 LAB — FERRITIN: Ferritin: 113.3 ng/mL (ref 10.0–291.0)

## 2024-04-18 NOTE — Telephone Encounter (Signed)
 Patient came into the office today to have the labs drawn.  She checked in with Megan and expressed frustration in regards to the same. Appears that some of the labs she is referring to (ferritin, IBC panel, and CBC w/ diff) were not entered correctly on 11/25 - they show as needs to be collected: instead of future so the lab did not see those orders. The CBC was done on 11/25 and repeated today.

## 2024-04-20 ENCOUNTER — Ambulatory Visit: Payer: PRIVATE HEALTH INSURANCE | Admitting: Sports Medicine

## 2024-04-20 VITALS — BP 126/84 | HR 82 | Ht 62.0 in | Wt 148.0 lb

## 2024-04-20 DIAGNOSIS — G8929 Other chronic pain: Secondary | ICD-10-CM | POA: Diagnosis not present

## 2024-04-20 DIAGNOSIS — M25562 Pain in left knee: Secondary | ICD-10-CM | POA: Diagnosis not present

## 2024-04-20 DIAGNOSIS — S86112D Strain of other muscle(s) and tendon(s) of posterior muscle group at lower leg level, left leg, subsequent encounter: Secondary | ICD-10-CM | POA: Diagnosis not present

## 2024-04-20 DIAGNOSIS — M79605 Pain in left leg: Secondary | ICD-10-CM

## 2024-04-20 NOTE — Progress Notes (Signed)
 [name] [address] [place] [address] Phone: [phone]   Assessment and Plan:     1. Chronic pain of left knee (Primary) 2. Left leg pain 3. Gastrocnemius strain, left, subsequent encounter -Chronic with exacerbation, subsequent sports medicine visit - Most consistent with recurrent strain of left gastrocnemius muscle, proximal tendon as it crosses posterior lateral knee joint space.  No sign of bruising, tearing on physical exam with patient having full strength, but pain with gastroc activation - Recommend no running for 2 weeks and then gradual return to activity as tolerated - Start meloxicam  15 mg daily x2 weeks.  If still having pain after 2 weeks, complete 3rd-week of NSAID. May use remaining NSAID as needed once daily for pain control.  Do not to use additional over-the-counter NSAIDs (ibuprofen , naproxen, Advil , Aleve, etc.) while taking prescription NSAIDs.  May use Tylenol  [phone] mg 2 to 3 times a day for breakthrough pain. - May use Flexeril nightly as needed for muscle spasms - Recommended not starting prednisone course that was provided at this time.  Could consider in the future. - Start HEP targeting calf muscle  15 additional minutes spent for educating Therapeutic Home Exercise Program.  This included exercises focusing on stretching, strengthening, with focus on eccentric aspects.   Long term goals include an improvement in range of motion, strength, endurance as well as avoiding reinjury. Patient's frequency would include in 1-2 times a day, 3-5 times a week for a duration of 6-12 weeks. Proper technique shown and discussed handout in great detail with ATC.  All questions were discussed and answered.      Pertinent previous records reviewed include [facility] note, prior sports medicine note, bilateral knee x-ray   Follow Up: 4 weeks for reevaluation.  If no improvement or worsening of symptoms, would  consider MRI of knee, tib-fib to evaluate for partial tearing of gastrocnemius versus lateral posterior corner injury   Subjective:   I, [name] Huang, am serving as a neurosurgeon for Doctor [name] Huang  Chief Complaint: knee pain   HPI:   04/20/2024 Patient is a 48 yeat old female with knee pain. Patient states left knee pain, hamstring pain. She ran half marathon in November. Was seen by Dr. Claudene 03/28/2024 [name] Huang is a 48 y.o. female coming in with complaint of L knee and calf pain since Saturday. Patient states that she stopped to catch her breath with .4 miles remaining in a half marathon. When she started to run again she developed a cramps in B legs. L leg pain persisted though. Calf, quad, and knee soreness. Having a hard time sleeping.   Pain resolved but came back after a run a mile in she felt a pop. Was seen by emergency ortho. Pain is only present when she applies pressure to her leg. Pain radiates down to her calf. She was given prednisone, and another med. Has been using advil  and that doesn't touch the pain . She does endorse antalgic gait.    Relevant Historical Information: None pertinent  Additional pertinent review of systems negative.  Current Medications[1]   Objective:     Vitals:   04/20/24 0756  BP: 126/84  Pulse: 82  SpO2: 99%  Weight: 148 lb (67.1 kg)  Height: 5' 2 (1.575 m)      Body mass index is 27.07 kg/m.    Physical Exam:    General:  awake, alert oriented, no acute distress nontoxic Skin: no  suspicious lesions or rashes Neuro:sensation intact and strength 5/5 with no deficits, no atrophy, normal muscle tone Psych: No signs of anxiety, depression or other mood disorder  Left knee: No swelling No deformity Neg fluid wave, joint milking ROM Flex 110, Ext 0 Mild TTP proximal lateral gastroc, left posterior corner of knee NTTP over the quad tendon, medial fem condyle, lat fem condyle, patella, patella tendon, tibial tuberostiy,  fibular head, posterior fossa, pes anserine bursa, gerdy's tubercle, medial jt line, lateral jt line Neg anterior and posterior drawer Neg lachman Negative varus stress Negative valgus stress Negative McMurray Negative Thessaly No pain with double or single-leg heel raise, however  pain through gastroc after completing single leg raise Gait normal  Negative straight leg raise No pain with lumbar extension, flexion, rotation  Electronically signed by:  [name] 8:43 AM 04/20/2024     [1]  Current Outpatient Medications:    Ashwagandha 500 MG CAPS, Take 500 mg by mouth daily., Disp: , Rfl:    MAGNESIUM GLUCONATE PO, Take 500 mg by mouth daily., Disp: , Rfl:

## 2024-04-20 NOTE — Patient Instructions (Signed)
-   Start meloxicam  15 mg daily x2 weeks.  If still having pain after 2 weeks, complete 3rd-week of NSAID. May use remaining NSAID as needed once daily for pain control.  Do not to use additional over-the-counter NSAIDs (ibuprofen , naproxen, Advil , Aleve, etc.) while taking prescription NSAIDs.  May use Tylenol  [phone] mg 2 to 3 times a day for breakthrough pain.  Flexeril at night only as needed for sleep   Do not start prednisone   Calf HEP   2 weeks of no running   -Recommend gradual return to physical activity.  Start activity at 50% (speed, duration, reps, sets, intensity) and allow 24 hours to assess for worsening pain.  If 50% is well-tolerated, may increase next activity to 75%.  If 75% is well-tolerated, may increase next physical activity to 100%.  If any of these levels cause pain, recommend dropping down to previous level for an additional 2-3 attempts before advancing.  4 week follow up with either Dr. Claudene or Dr. Leonce

## 2024-05-08 ENCOUNTER — Other Ambulatory Visit: Payer: Self-pay | Admitting: Sports Medicine

## 2024-05-08 ENCOUNTER — Telehealth: Payer: Self-pay | Admitting: Sports Medicine

## 2024-05-08 DIAGNOSIS — M79605 Pain in left leg: Secondary | ICD-10-CM

## 2024-05-08 DIAGNOSIS — G8929 Other chronic pain: Secondary | ICD-10-CM

## 2024-05-08 NOTE — Telephone Encounter (Signed)
 Pt seen 12/18 for L knee pain, has not improved. Per pt, MRI was offered if no approval at this point.  Pt requesting we order MRI L knee.

## 2024-05-08 NOTE — Telephone Encounter (Signed)
Referral for MRI placed 

## 2024-05-11 NOTE — Progress Notes (Unsigned)
 " [name] [address] [place] [address] Phone: [phone] Subjective:    I'm seeing this patient by the request  of:  Cleotilde Planas, MD  CC:   YEP:[id number]  03/28/2024 Concerned that muscle cramping is secondary to the iron  deficiency anemia.  The patient did have worsening symptoms.  Discussed icing regimen and home exercises, which activities to do and which ones to avoid.  Increasing activity slowly.  Discussed rechecking the iron  levels.  Do think dehydration is also playing a fairly significant role.  Follow-up again in 6 to 12 weeks otherwise.  No sign of any type of muscle tear at this moment.  Patient does use her A lot.  Differential does not show any significant concern for any type weakness thrombosis.  We did discuss that there is a potential for exertional compartment syndrome but patient was able to run 13 miles.  Patient will continue with some stretching otherwise and hydration until labs come back and see if that will change management.  I personally spent a total of 33 minutes in the care of the patient today including preparing to see the patient, performing a medically appropriate exam/evaluation, counseling and educating, documenting clinical information in the EHR, communicating results, and coordinating care.        Update 05/15/2024 [name] Huang is a 49 y.o. female coming in with complaint of fatigue. Patient states      Past Medical History:  Diagnosis Date   Abnormal colonoscopy    precancerous cells removed   Anemia    Asthma    Carpal tunnel syndrome    History of kidney stones    IBS (irritable bowel syndrome)    Kidney stone    Mild hyperemesis gravidarum, antepartum    Poison ivy    PP care - 1C/S 5/21 09/23/2011   PUPPP (pruritic urticarial papules and plaques of pregnancy)    Past Surgical History:  Procedure Laterality Date   ABDOMINOPLASTY     BREAST REDUCTION SURGERY  09/2021   CESAREAN SECTION   09/22/2011   Procedure: CESAREAN SECTION;  Surgeon: Charlie JINNY Flowers, MD;  Location: [facility];  Service: Gynecology;  Laterality: N/A;   CESAREAN SECTION N/A 04/04/2013   Procedure: Repeat CESAREAN SECTION;  Surgeon: Charlie JINNY Flowers, MD;  Location: [facility];  Service: Obstetrics;  Laterality: N/A;  EDD: 04/05/13   CESAREAN SECTION N/A 06/21/2015   Procedure: Repeat CESAREAN SECTION;  Surgeon: Charlie Flowers, MD;  Location: [facility];  Service: Obstetrics;  Laterality: N/A;  EDD: 06/28/15    COLONOSCOPY     CYSTOSCOPY WITH RETROGRADE PYELOGRAM, URETEROSCOPY AND STENT PLACEMENT Right 05/13/2022   Procedure: CYSTOSCOPY WITH RETROGRADE PYELOGRAM, URETEROSCOPY AND STENT PLACEMENT; FIRST STAGE;  Surgeon: Alvaro Hummer, MD;  Location: [facility];  Service: Urology;  Laterality: Right;  75 MINS   CYSTOSCOPY WITH RETROGRADE PYELOGRAM, URETEROSCOPY AND STENT PLACEMENT Right 06/05/2022   Procedure: SECOND STAGE CYSTOSCOPY WITH RETROGRADE PYELOGRAM, URETEROSCOPY AND STENT EXCHANGE;  Surgeon: Alvaro Hummer, MD;  Location: [facility];  Service: Urology;  Laterality: Right;  90 MINS   HOLMIUM LASER APPLICATION Right 05/13/2022   Procedure: HOLMIUM LASER APPLICATION;  Surgeon: Alvaro Hummer, MD;  Location: [facility];  Service: Urology;  Laterality: Right;   HOLMIUM LASER APPLICATION Right 06/05/2022   Procedure: HOLMIUM LASER APPLICATION;  Surgeon: Alvaro Hummer, MD;  Location: [facility];  Service: Urology;  Laterality: Right;   ROOT CANAL     WISDOM TOOTH EXTRACTION  Social History   Socioeconomic History   Marital status: Divorced    Spouse name: Not on file   Number of children: Not on file   Years of education: Not on file   Highest education level: Not on file  Occupational History   Not on file  Tobacco Use   Smoking status: Former    Current packs/day: 0.00    Average packs/day: 0.5 packs/day for 6.0 years (3.0 ttl pk-yrs)    Types: Cigarettes    Start date: 11/20/1998    Quit date: 11/19/2004    Years  since quitting: 19.4   Smokeless tobacco: Never  Vaping Use   Vaping status: Never Used  Substance and Sexual Activity   Alcohol use: Yes    Comment: twice monthly   Drug use: No   Sexual activity: Yes  Other Topics Concern   Not on file  Social History Narrative   Not on file   Social Drivers of Health   Tobacco Use: Medium Risk (03/28/2024)   Patient History    Smoking Tobacco Use: Former    Smokeless Tobacco Use: Never    Passive Exposure: Not on Actuary Strain: Not on file  Food Insecurity: Not on file  Transportation Needs: Not on file  Physical Activity: Not on file  Stress: Not on file  Social Connections: Not on file  Depression (PHQ2-9): Not on file  Alcohol Screen: Not on file  Housing: Not on file  Utilities: Not on file  Health Literacy: Not on file   Allergies[1] Family History  Problem Relation Age of Onset   Hypertension Mother    Diabetes Mother    Peripheral vascular disease Mother    Hypertension Father    Diabetes Father    Mental illness Father        bipolar   Heart attack Paternal Grandfather    Heart disease Paternal Grandfather    Colon cancer Other    Breast cancer Maternal Aunt    Breast cancer Maternal Grandmother    Anesthesia problems Neg Hx    Hypotension Neg Hx    Malignant hyperthermia Neg Hx    Pseudochol deficiency Neg Hx     Current Outpatient Medications (Other):    Ashwagandha 500 MG CAPS, Take 500 mg by mouth daily.   MAGNESIUM GLUCONATE PO, Take 500 mg by mouth daily.   Reviewed prior external information including notes and imaging from  primary care provider As well as notes that were available from care everywhere and other healthcare systems.  Past medical history, social, surgical and family history all reviewed in electronic medical record.  No pertanent information unless stated regarding to the chief complaint.   Review of Systems:  No headache, visual changes, nausea, vomiting, diarrhea,  constipation, dizziness, abdominal pain, skin rash, fevers, chills, night sweats, weight loss, swollen lymph nodes, body aches, joint swelling, chest pain, shortness of breath, mood changes. POSITIVE muscle aches  Objective  unknown if currently breastfeeding.   General: No apparent distress alert and oriented x3 mood and affect normal, dressed appropriately.  HEENT: Pupils equal, extraocular movements intact  Respiratory: Patient's speak in full sentences and does not appear short of breath  Cardiovascular: No lower extremity edema, non tender, no erythema      Impression and Recommendations:     The above documentation has been reviewed and is accurate and complete Arthea CHRISTELLA Sharps, DO       [1]  Allergies Allergen Reactions   Chlorhexidine   Itching and Rash    Red and itching after using CHG wipes and shaving   Amoxicillin Hives    Has patient had a PCN reaction causing immediate rash, facial/tongue/throat swelling, SOB or lightheadedness with hypotension: unknown Has patient had a PCN reaction causing severe rash involving mucus membranes or skin necrosis: unknown Has patient had a PCN reaction that required hospitalization: unknown Has patient had a PCN reaction occurring within the last 10 years: unknown If all of the above answers are NO, then may proceed with Cephalosporin use.    Egg Protein-Containing Drug Products     GI symptoms   Feraheme [Ferumoxytol ] Other (See Comments)    Chest pain/tightness, voice change (huskiness)   Other     Tree Nuts GI symptoms and bolbus feeling in throat   Oxycodone  Hives   Tape Other (See Comments)    Tape on eyes burned after being removed after last anesthesia   "

## 2024-05-15 ENCOUNTER — Ambulatory Visit: Payer: PRIVATE HEALTH INSURANCE | Admitting: Family Medicine

## 2024-05-17 ENCOUNTER — Other Ambulatory Visit: Payer: PRIVATE HEALTH INSURANCE

## 2024-05-19 ENCOUNTER — Other Ambulatory Visit: Payer: PRIVATE HEALTH INSURANCE

## 2024-05-26 ENCOUNTER — Ambulatory Visit
Admission: RE | Admit: 2024-05-26 | Discharge: 2024-05-26 | Disposition: A | Payer: PRIVATE HEALTH INSURANCE | Source: Ambulatory Visit | Attending: Sports Medicine

## 2024-05-26 DIAGNOSIS — G8929 Other chronic pain: Secondary | ICD-10-CM

## 2024-05-26 DIAGNOSIS — M79605 Pain in left leg: Secondary | ICD-10-CM

## 2024-05-30 ENCOUNTER — Telehealth: Payer: PRIVATE HEALTH INSURANCE | Admitting: Sports Medicine

## 2024-05-30 DIAGNOSIS — M25562 Pain in left knee: Secondary | ICD-10-CM | POA: Diagnosis not present

## 2024-05-30 DIAGNOSIS — G8929 Other chronic pain: Secondary | ICD-10-CM

## 2024-05-30 DIAGNOSIS — M79605 Pain in left leg: Secondary | ICD-10-CM

## 2024-05-30 DIAGNOSIS — S83242A Other tear of medial meniscus, current injury, left knee, initial encounter: Secondary | ICD-10-CM | POA: Diagnosis not present

## 2024-05-30 NOTE — Progress Notes (Signed)
 "               [name] [name] [address] [place] [address] Phone: [phone]    Virtual Visit Note  Assessment and Plan:     1. Left leg pain (Primary) 2. Chronic pain of left knee 3. 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  Discussed small medial meniscal tear, mild degenerative changes.  No significant findings on tib-fib MRI  - Use meloxicam  15 mg daily as needed for breakthrough pain.  Recommend limiting chronic NSAIDs to 1-2 doses per week to prevent long-term side effects. Use Tylenol  500 to 1000 mg tablets 2-3 times a day as needed for day-to-day pain relief.     Pertinent previous records reviewed include left knee MRI, left tib-fib MRI 05/26/2024   Follow Up: Patient to call and schedule follow-up for left knee CSI.  Would then start physical therapy 1 to 2 weeks after injection and follow-up with patient 6 weeks after injection for reevaluation.    I connected with patient by Epic video enabled telemedicine application and verified that I am speaking with the correct person using two identifiers. Patient agreed to proceed via telephone. I discussed the limitations of evaluation and management by telemedicine and the availability of in person appointments. The patient expressed understanding and agreed to proceed.  Location: Patient: Home setting Provider: In office setting  Time of visit 23 minutes,  which included telephone discussion, chart review, treatment plan discussion with patient, and documentation at today's telemedicine visit.     Subjective:    Chief Complaint: knee pain    HPI:    04/20/2024 Patient is a 49 yeat old female with knee pain. Patient states left knee pain, hamstring pain. She ran half marathon in November. Was seen by Dr. Claudene 03/28/2024 [name] Huang is a 49 y.o. female coming in with complaint of L knee and calf pain since Saturday. Patient states that she stopped to catch her breath with .4 miles remaining in a half marathon. When she started to run again she developed a cramps in B legs. L leg pain persisted though. Calf, quad, and knee soreness. Having a hard time sleeping.    Pain resolved but came back after a run a mile in she felt a pop. Was seen by emergency ortho. Pain is only present when she applies pressure to her leg. Pain radiates down to her calf. She was given prednisone, and another med. Has been using advil  and that doesn't touch the pain . She does endorse antalgic gait.    05/30/2024 Patient states continued pain in posterior lateral corner of knee with physical activity.  No significant relief with medication, HEP.  Relevant Historical Information: None pertinent  Additional pertinent review of systems negative.  Current Medications[1]   Objective:    Alert and doing well.  Very pleasant over the phone  Electronically signed by:  [name] [name] 11:57 AM 05/30/24    [1]  Current Outpatient Medications:    Ashwagandha 500  MG CAPS, Take 500 mg by mouth daily., Disp: , Rfl:    MAGNESIUM GLUCONATE PO, Take 500 mg by mouth daily., Disp: , Rfl:   "
